# Patient Record
Sex: Female | Born: 1990
Health system: Southern US, Community
[De-identification: ages and names within clinical notes are randomized; demographics above are authoritative.]

---

## 2015-12-29 MED FILL — METHYLPHENIDATE 10 MG TAB: 10 | 30 days supply | Qty: 60 | Fill #0

## 2016-01-31 MED FILL — METHYLPHENIDATE ER 20 MG CA: 20 | 30 days supply | Qty: 30 | Fill #0

## 2016-06-28 MED FILL — CLINDAMYCIN HCL 300 MG CAP: 300 | 5 days supply | Qty: 15 | Fill #0

## 2016-07-12 MED FILL — NORETHIND-ETH ESTRAD 1-0.02: 1-20 | 28 days supply | Qty: 21 | Fill #0

## 2016-07-23 MED FILL — METHYLPHENIDATE ER 30 MG CA: 30 | 30 days supply | Qty: 30 | Fill #0

## 2016-08-17 MED FILL — NORETHIND-ETH ESTRAD 1-0.02: 1-20 | 84 days supply | Qty: 63 | Fill #1

## 2016-09-26 MED FILL — predniSONE 20 MG TABS: 20 | 5 days supply | Qty: 5 | Fill #0

## 2016-10-03 MED FILL — AZITHROMYCIN 250 MG TAB: 250 | 5 days supply | Qty: 6 | Fill #0

## 2016-11-12 MED FILL — NORETHIND-ETH ESTRAD 1-0.02: 1-20 | 84 days supply | Qty: 63 | Fill #2

## 2016-11-27 MED FILL — METHYLPHENIDATE ER 20 MG CA: 20 | 30 days supply | Qty: 30 | Fill #0

## 2016-12-10 MED FILL — VENTOLIN HFA 90 MCG INHALER: 108 (90 BAS | 16 days supply | Qty: 18 | Fill #0

## 2017-01-30 MED FILL — NORETHIND-ETH ESTRAD 1-0.02: 1-20 | 84 days supply | Qty: 63 | Fill #3

## 2017-03-29 MED FILL — predniSONE 50 MG TABS: 50 | 1 days supply | Qty: 3 | Fill #0

## 2017-04-24 IMAGING — CT CT NECK W/ CM
5 of 6 series · 15 of 33 positions shown, 17 images · IV contrast (ISOVUE 300)
Comparison: None.

CLINICAL DATA: Bilateral ear pain over the last year. Assess for
pharyngeal lesion.

EXAM:
CT NECK WITH CONTRAST
TECHNIQUE: Multidetector CT imaging of the neck was performed using the
standard protocol following the bolus administration of intravenous
contrast.
CONTRAST:  75 cc [D9]

[Series 2: axial neck · axial · 0.41mm/px · z∈[-311,-199]mm · 3 of 112 slices shown, 4 images]
[im 28/112  soft-tissue]
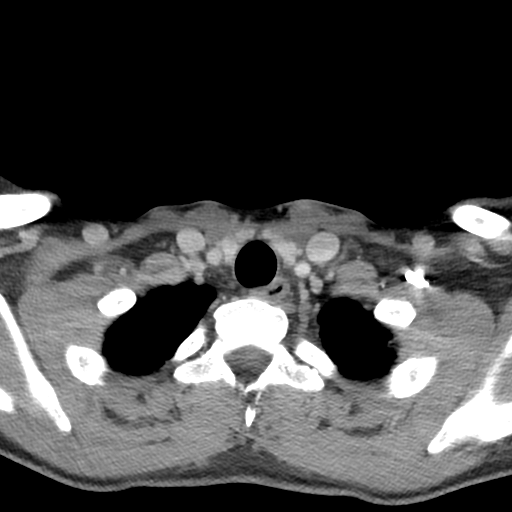
[im 28/112  bone]
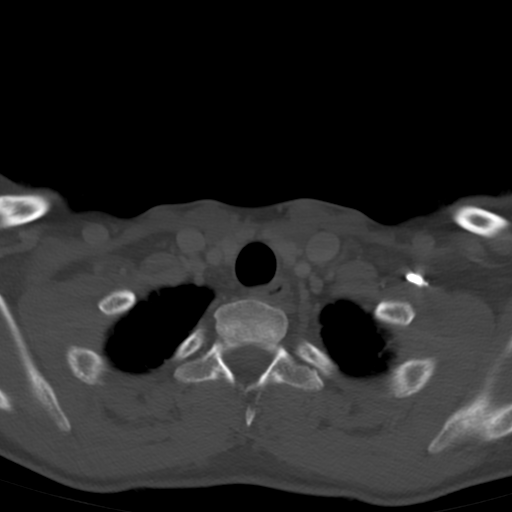
[im 56/112  bone]
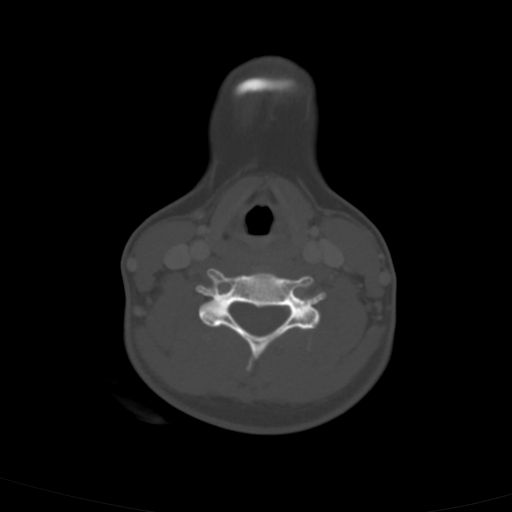
[im 84/112  bone]
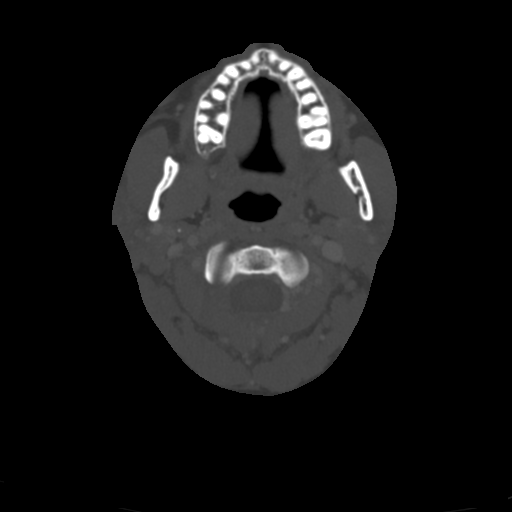

[Series 5: axial bone · axial · 0.41mm/px · z∈[-292,-218]mm · 2 of 112 slices shown]
[im 38/112  bone]
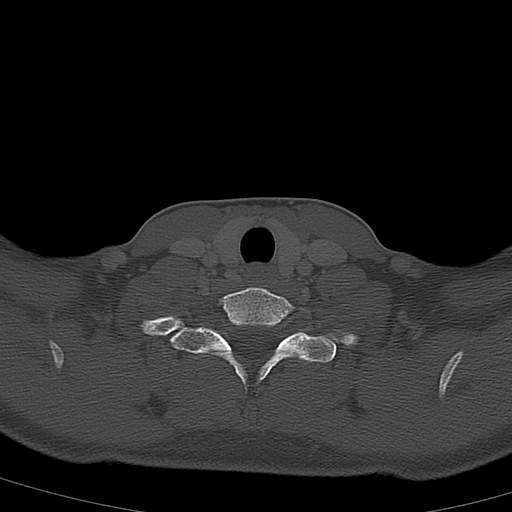
[im 75/112  bone]
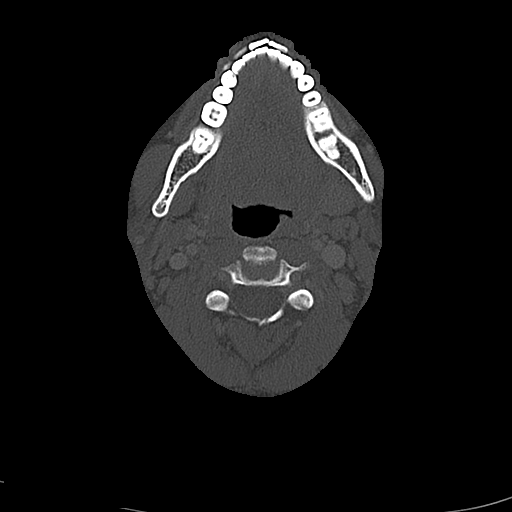

[Series 6: orthogonal ax · axial · 0.39mm/px · z∈[-307,-231]mm · 2 of 119 slices shown]
[im 40/119  bone]
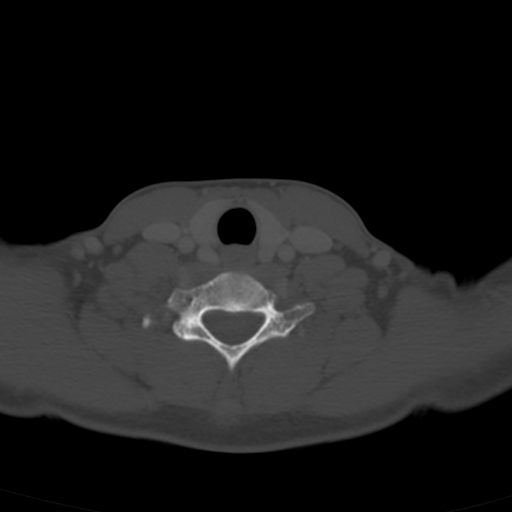
[im 79/119  bone]
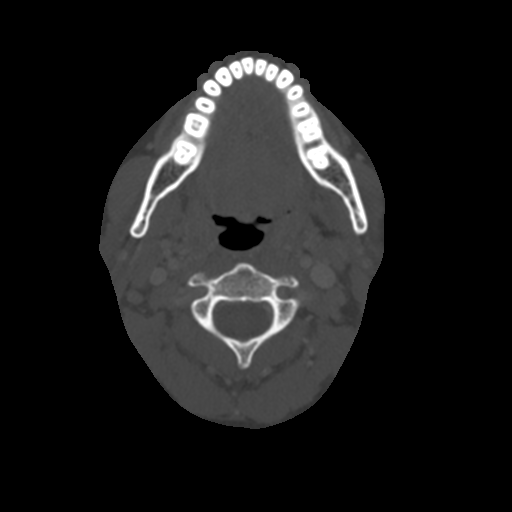

[Series 7: cor neck · coronal · 0.49mm/px · 3 of 101 slices shown]
[im 21/101  bone]
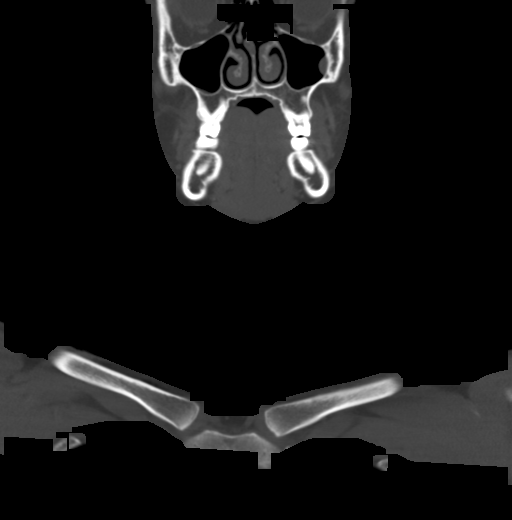
[im 41/101  bone]
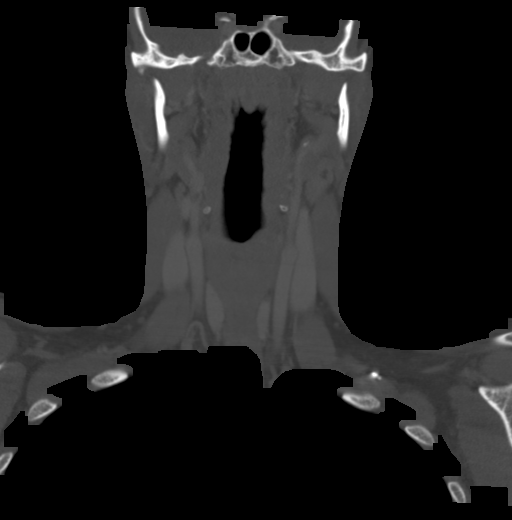
[im 61/101  bone]
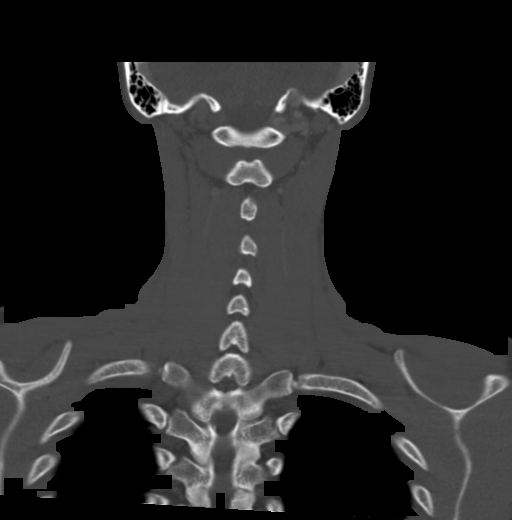

[Series 8: sag neck · sagittal · 0.49mm/px · 5 of 101 slices shown, 6 images]
[im 34/101  bone]
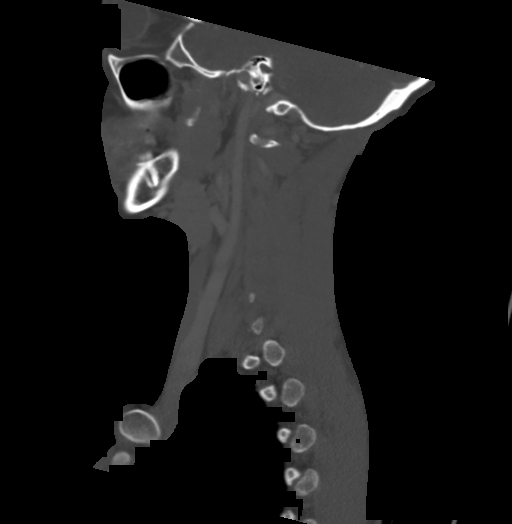
[im 42/101  bone]
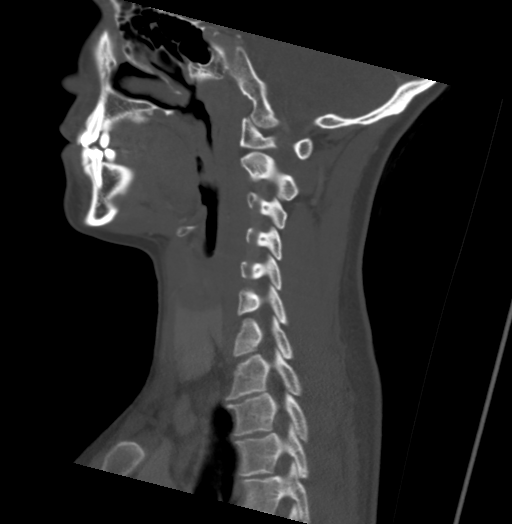
[im 51/101  soft-tissue]
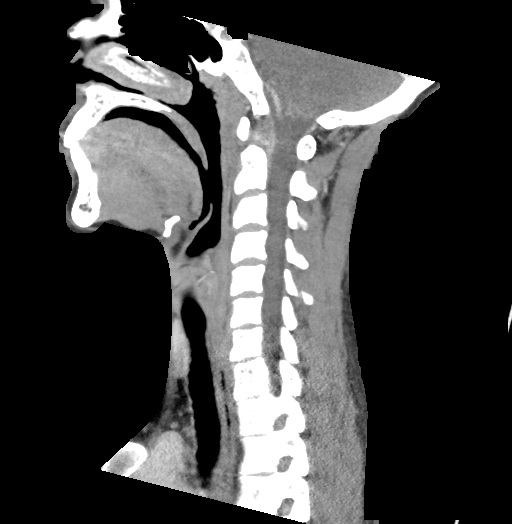
[im 51/101  bone]
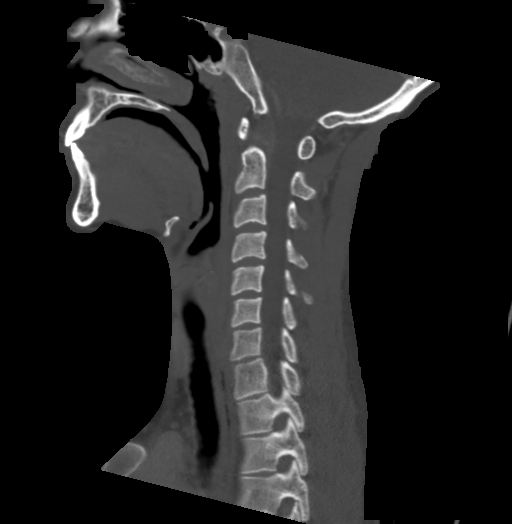
[im 59/101  bone]
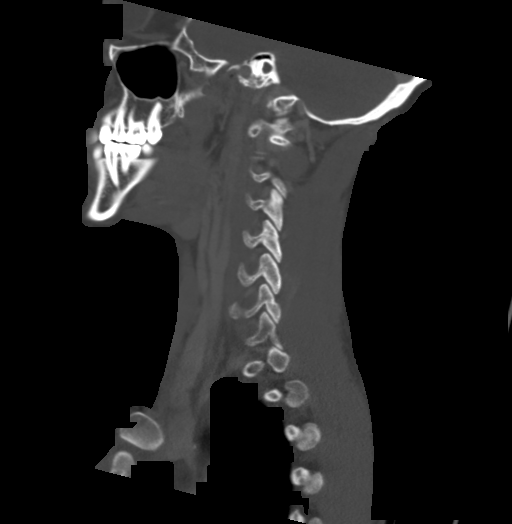
[im 67/101  bone]
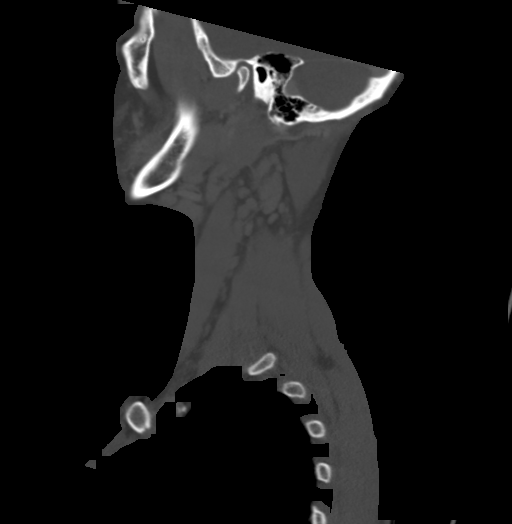

[15 of 33 positions shown; findings below may reference images not displayed]

FINDINGS: Pharynx and larynx: No mucosal or submucosal lesion is seen.
Nasopharynx appears normal. No lesions seen that 1 would expect to
affect the Eustacian tubes.

Salivary glands: Parotid and submandibular glands are normal.

Thyroid: Normal

Lymph nodes: No enlarged or low-density nodes seen on either side of
the neck. Normal nodes on each side.

Vascular: Normal

Limited intracranial: Normal

Visualized orbits: Normal

Mastoids and visualized paranasal sinuses: Visualized sinuses are
clear. Mastoid regions and middle ears appear clear and normal.

Skeleton: Normal.  Styloid processes within normal limits.

Upper chest: Normal

Other: None
IMPRESSION: No abnormality seen to explain the presenting symptoms. No
nasopharyngeal lesion. No sign of Eustacian tube dysfunction.

## 2017-04-24 MED FILL — NORETHIND-ETH ESTRAD 1-0.02: 1-20 | 84 days supply | Qty: 63 | Fill #0

## 2017-05-20 MED FILL — KETOCONAZOLE 200 MG TABLET: 200 | 3 days supply | Qty: 6 | Fill #0

## 2017-05-22 MED FILL — CLOTRIMAZOLE-BETAMETHASONE: 1-0.05 | 30 days supply | Qty: 45 | Fill #0

## 2017-07-12 MED FILL — MONTELUKAST SOD 10 MG TAB: 10 | 30 days supply | Qty: 30 | Fill #0

## 2017-07-12 MED FILL — METHYLPHENIDATE ER 20 MG CA: 20 | 30 days supply | Qty: 30 | Fill #0

## 2017-07-17 MED FILL — AZITHROMYCIN 250 MG TAB: 250 | 3 days supply | Qty: 6 | Fill #0

## 2017-07-17 MED FILL — ONDANSETRON ODT 8 MG TABLET: 8 | 3 days supply | Qty: 10 | Fill #0

## 2017-07-17 MED FILL — NORETHIND-ETH ESTRAD 1-0.02: 1-20 | 84 days supply | Qty: 63 | Fill #1

## 2017-07-17 MED FILL — ATOVAQUONE-PROGUANIL 250-10: 250-100 | 16 days supply | Qty: 16 | Fill #0

## 2017-07-26 MED FILL — ONDANSETRON HCL 4 MG TABLET: 4 | 7 days supply | Qty: 20 | Fill #0

## 2017-07-26 MED FILL — AZITHROMYCIN 250 MG TABS: 250 | 5 days supply | Qty: 6 | Fill #0

## 2017-07-29 MED FILL — ATOVAQUONE-PROGUANIL 250-10: 250-100 | 90 days supply | Qty: 90 | Fill #0

## 2017-10-09 MED FILL — NORETHIND-ETH ESTRAD 1-0.02: 1-20 | 63 days supply | Qty: 63 | Fill #2

## 2017-12-17 MED FILL — CLOBETASOL 0.05% SOLUTION: 0.05 | 20 days supply | Qty: 50 | Fill #0

## 2017-12-31 MED FILL — LEVONOR-ETH ESTRAD 0.15-0.0: 0.15-30 | 84 days supply | Qty: 84 | Fill #0

## 2018-01-08 MED FILL — VENTOLIN HFA 90 MCG INHALER: 108 (90 BAS | 16 days supply | Qty: 18 | Fill #0

## 2018-12-08 IMAGING — US ULTRASOUND RIGHT BREAST LIMITED
1 series · 4 of 4 positions shown · non-contrast
Comparison: None.

CLINICAL DATA: 27-year-old female complaining of palpable
abnormalities in the medial and upper aspects of the right breast.

EXAM:
ULTRASOUND OF THE RIGHT BREAST

[Series 1: ultrasound right breast limited · 0.06mm/px · 4 of 4 slices shown]
[im 1/4]
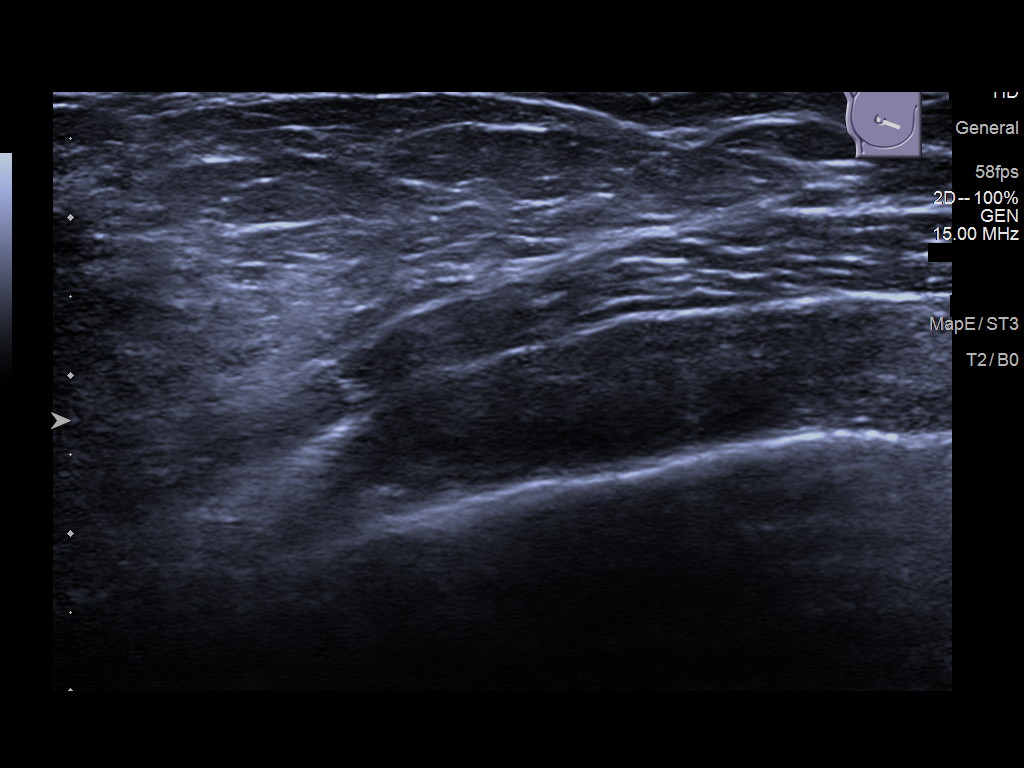
[im 2/4]
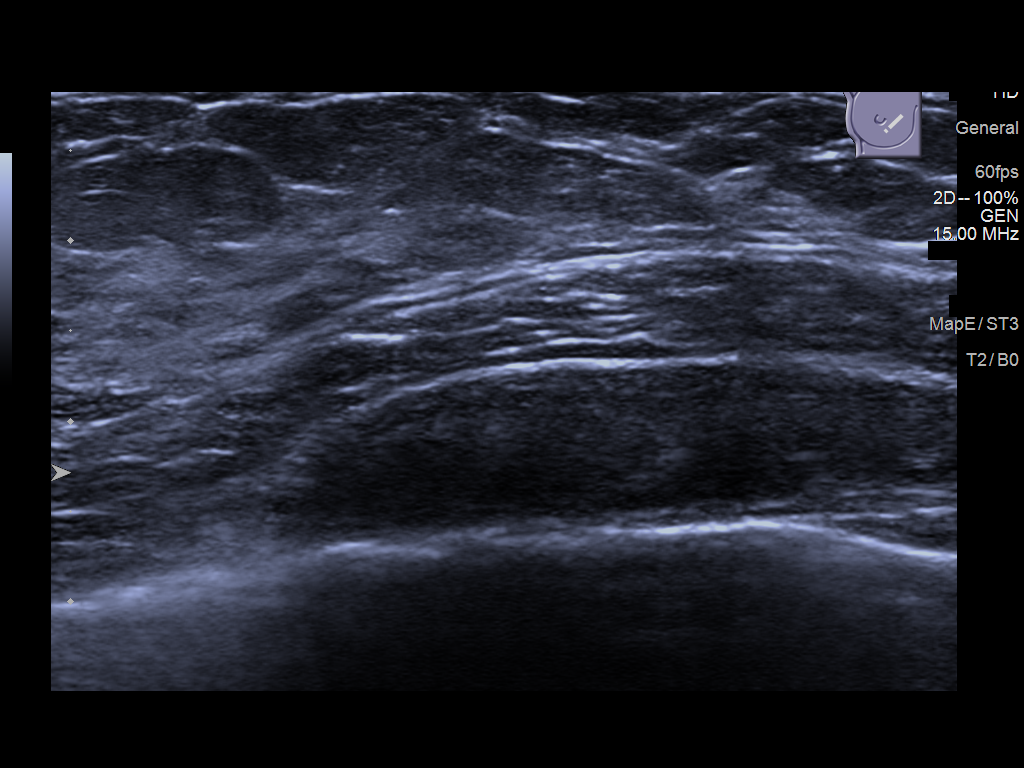
[im 3/4]
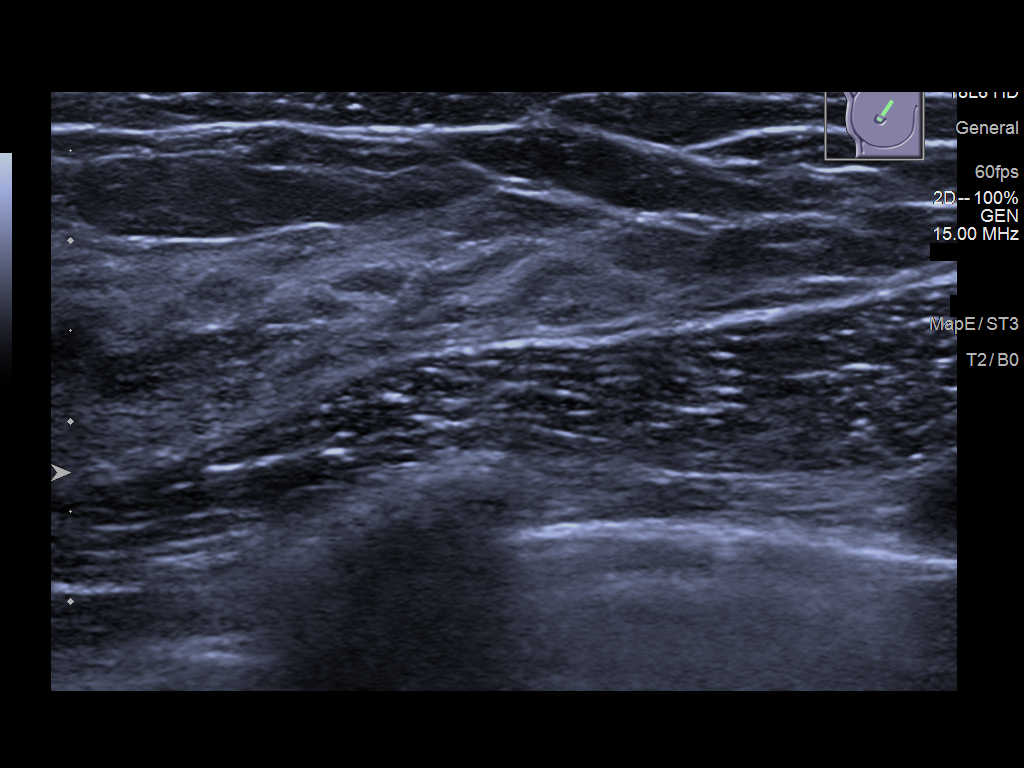
[im 4/4]
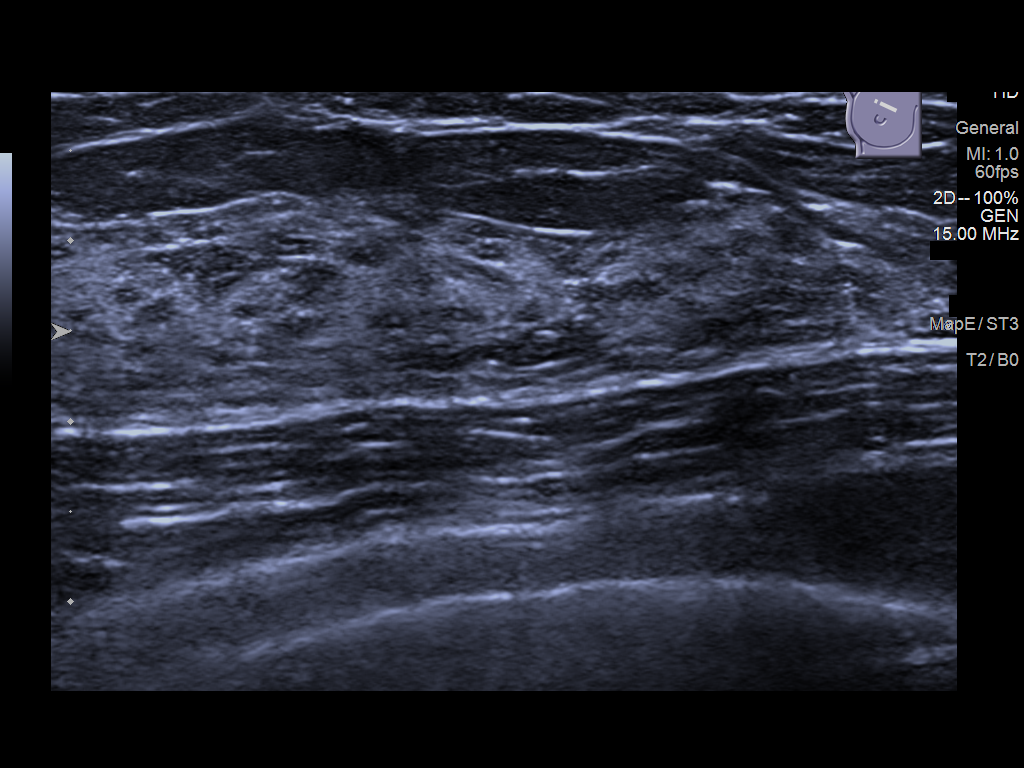

[4 of 4 positions shown; findings below may reference images not displayed]

FINDINGS: On physical exam, I do not palpate a mass in the superior aspect of
the right breast or the medial aspect of the right breast.

Targeted ultrasound is performed, showing normal tissue in the areas
of clinical concern in the medial aspect of the right breast as well
as the superior aspect of the breast. No solid or cystic mass,
abnormal shadowing or distortion visualized.
IMPRESSION: No sonographic evidence of malignancy in the areas of clinical
concern.

RECOMMENDATION:
If the clinical exam remains benign/stable screening mammography can
be deferred until the age 39.

I have discussed the findings and recommendations with the patient.
Results were also provided in writing at the conclusion of the
visit. If applicable, a reminder letter will be sent to the patient
regarding the next appointment.

BI-RADS CATEGORY  1: Negative.

## 2019-02-12 MED FILL — ALPRAZolam 0.25 MG TABS: 0.25 | 30 days supply | Qty: 30 | Fill #0

## 2019-03-03 MED FILL — ALPRAZolam 0.25 MG TABS: 0.25 | 30 days supply | Qty: 30 | Fill #0

## 2019-10-17 IMAGING — MR MR THORACIC SPINE W/O CM
6 of 7 series · 29 of 48 positions shown · non-contrast
Comparison: None available.

CLINICAL DATA: Initial evaluation for headaches, family history of
familial cerebral cavernous malformation with prior hemorrhage.

EXAM:
MRI THORACIC SPINE WITHOUT CONTRAST
TECHNIQUE: Multiplanar, multisequence MR imaging of the thoracic spine was
performed. No intravenous contrast was administered.

[Series 18: T1 · sagittal · 6.0mm · 1.41mm/px · 4 of 9 slices shown (1 of 2)]
[im 1/9]
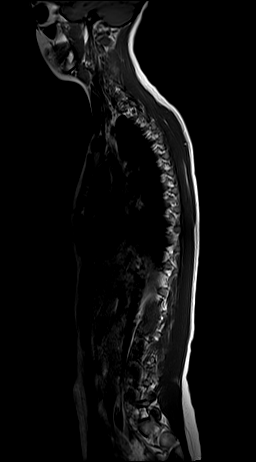
[im 3/9]
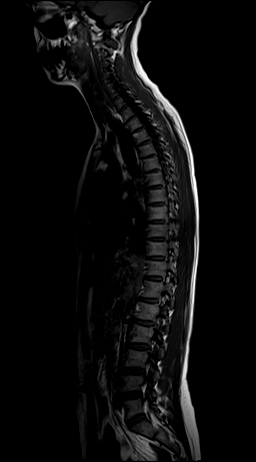
[im 6/9]
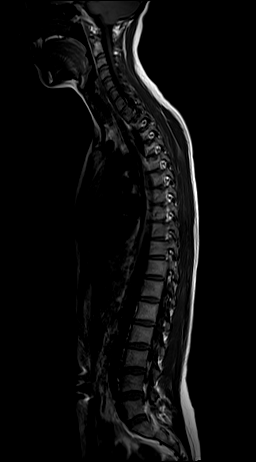
[im 9/9]
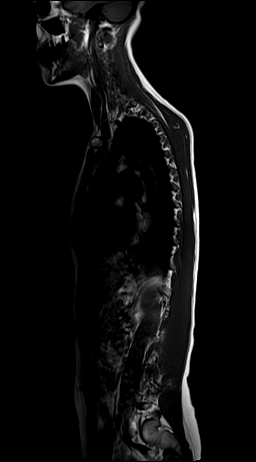

[Series 19: T2 · sagittal · 3.0mm · 1.06mm/px · 5 of 17 slices shown (1 of 2)]
[im 1/17]
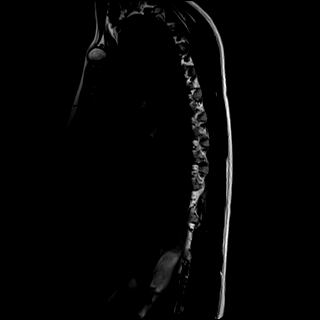
[im 5/17]
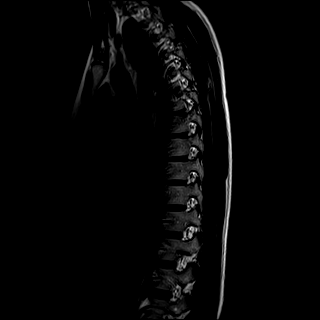
[im 9/17]
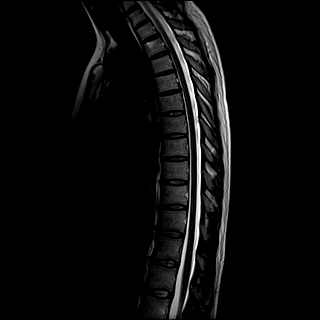
[im 13/17]
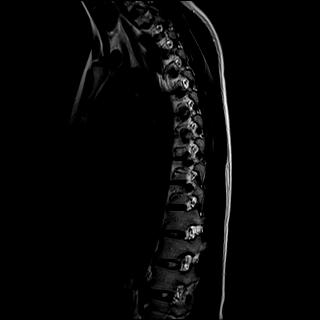
[im 17/17]
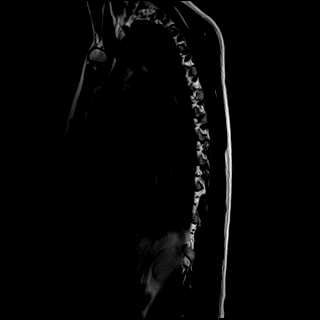

[Series 20: T1 · sagittal · 3.0mm · 1.06mm/px · 5 of 17 slices shown (2 of 2)]
[im 1/17]
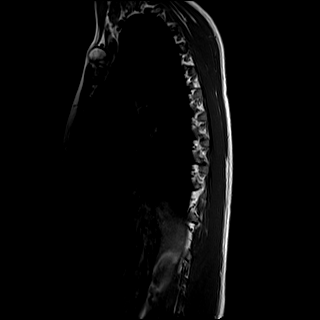
[im 5/17]
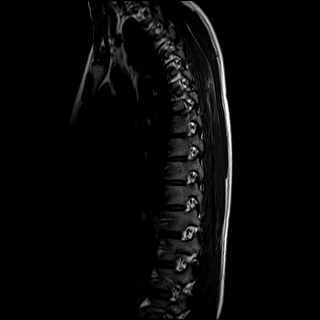
[im 9/17]
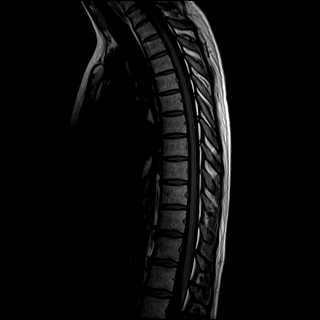
[im 13/17]
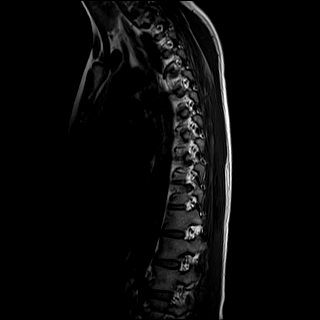
[im 17/17]
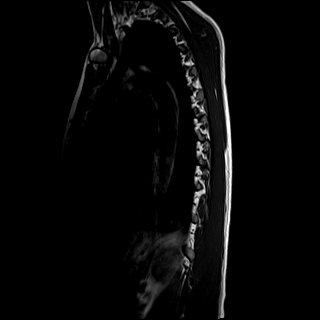

[Series 21: STIR · sagittal · 3.0mm · 0.53mm/px · 5 of 17 slices shown]
[im 1/17]
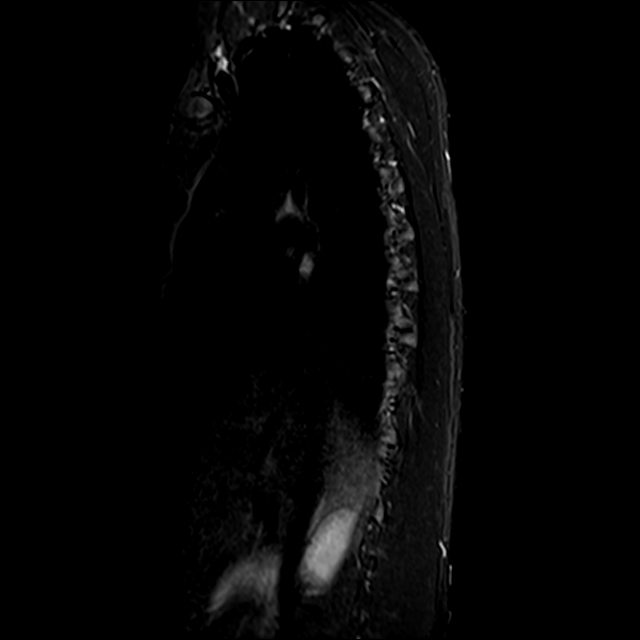
[im 5/17]
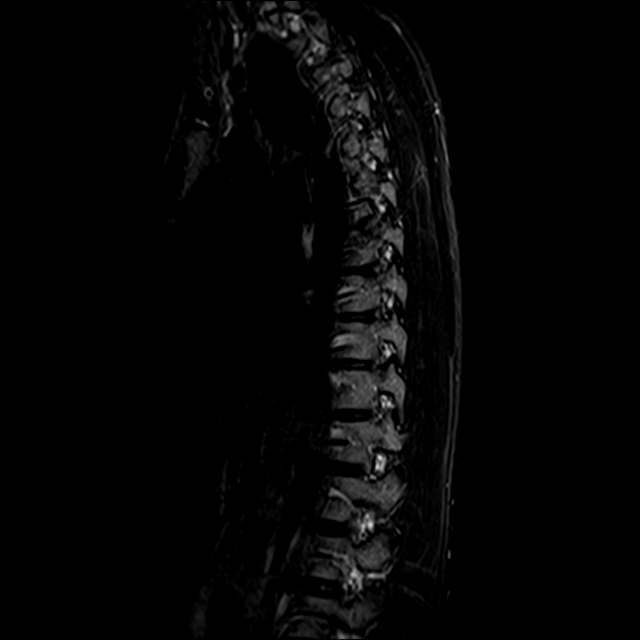
[im 9/17]
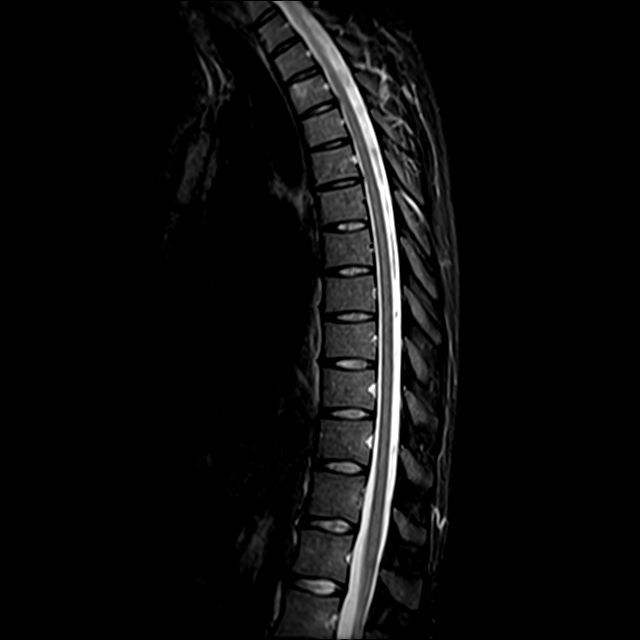
[im 13/17]
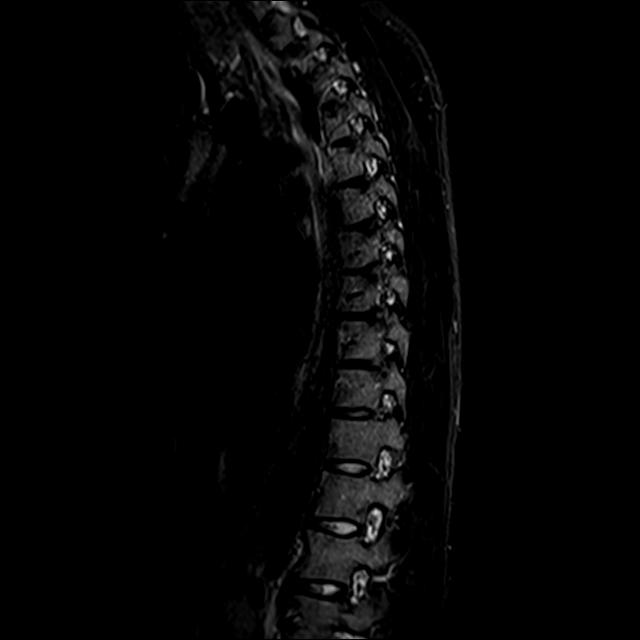
[im 17/17]
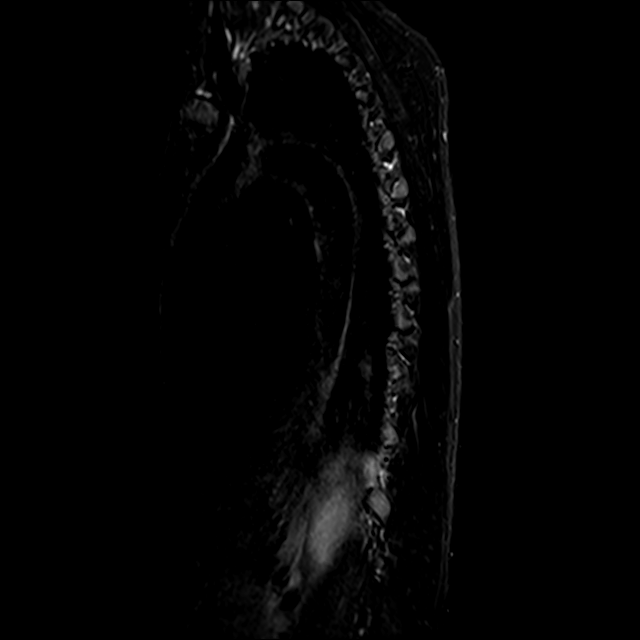

[Series 22: T2-star · sagittal · 3.0mm · 0.66mm/px · 1 of 17 slices shown]
[im 1/17]
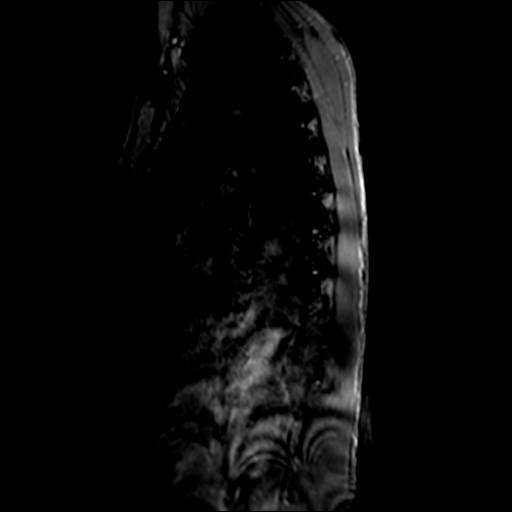

[Series 23: T2 · axial · 4.0mm · 0.59mm/px · z∈[-341,-112]mm · 9 of 39 slices shown (2 of 2)]
[im 1/39]
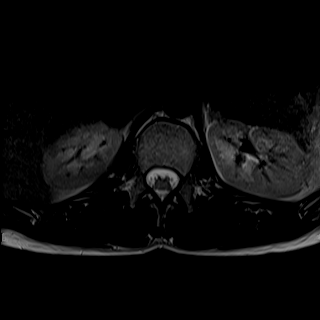
[im 7/39]
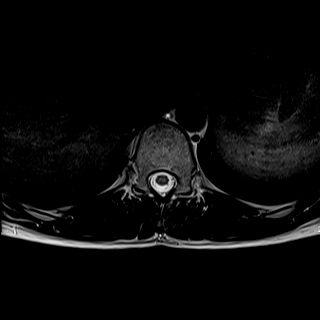
[im 11/39]
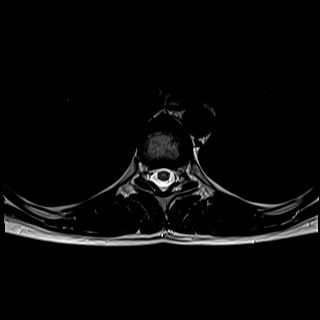
[im 18/39]
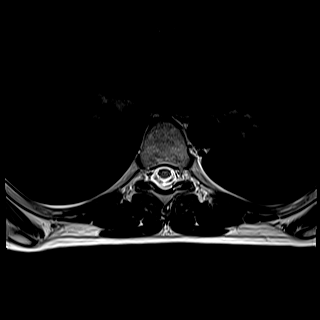
[im 21/39]
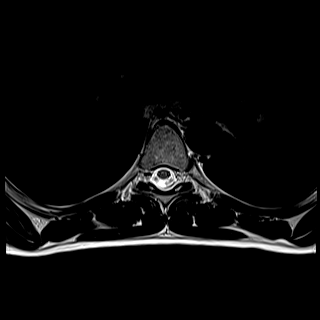
[im 28/39]
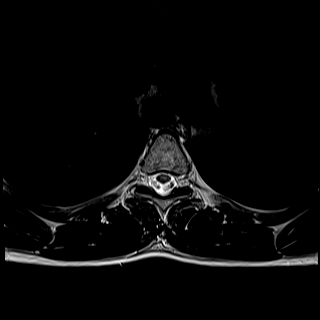
[im 32/39]
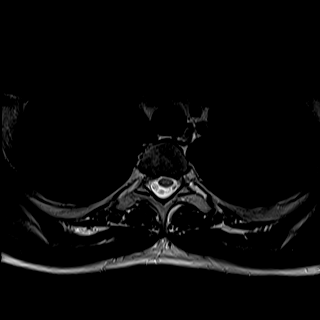
[im 35/39]
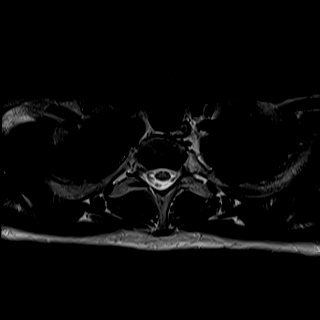
[im 39/39]
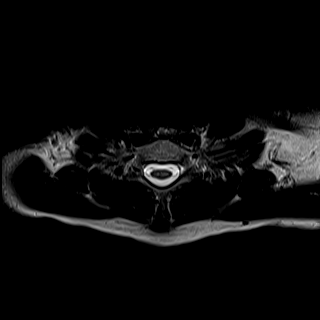

[29 of 48 positions shown; findings below may reference images not displayed]

FINDINGS: Alignment: Vertebral bodies normally aligned with preservation of
the normal thoracic kyphosis. No listhesis or subluxation.

Vertebrae: Vertebral body height well maintained without evidence
for acute or chronic fracture. Few small benign hemangiomata noted,
most prominent of which measures 9 mm at the T3 vertebral body. No
worrisome osseous lesions. No abnormal marrow edema.

Cord: Signal intensity within the thoracic spinal cord is normal.
Normal cord caliber and morphology. No susceptibility artifact to
suggest acute or prior hemorrhage.

Paraspinal and other soft tissues: Paraspinous soft tissues within
normal limits. Partially visualized lungs are clear. Visualized
visceral structures are unremarkable.

Disc levels:

T2-3: Minimal disc bulge. No canal or foraminal stenosis. No cord
deformity.

T3-4: Tiny right paracentral disc protrusion mildly a indents the
right ventral thecal sac. No significant stenosis or cord deformity.
Foramina remain patent.

T4-5: Minimal disc bulge with superimposed tiny right paracentral
disc protrusion. No significant spinal stenosis or cord deformity.
Foramina remain patent.

T5-6: Tiny central disc protrusion minimally indents the ventral
thecal sac, slightly asymmetric to the right. No significant
stenosis or cord deformity. Foramina remain widely patent.

No other significant disc pathology seen within the thoracic spine.
No significant facet degeneration. No other stenosis or evidence for
impingement.
IMPRESSION: 1. Tiny disc protrusions at T3-4 through T5-6 without significant
stenosis or cord impingement as above.
2. Otherwise unremarkable and normal MRI of the thoracic spine. No
evidence for acute or prior hemorrhage involving the thoracic spinal
cord.

## 2019-10-17 IMAGING — MR MR HEAD W/O CM
11 of 12 series · 39 of 48 positions shown · non-contrast
Comparison: None available.

CLINICAL DATA: Initial evaluation for headaches. Family history of
familial cerebral cavernous malformation with prior hemorrhage.

EXAM:
MRI HEAD WITHOUT CONTRAST
MRA HEAD WITHOUT CONTRAST
TECHNIQUE: Multiplanar, multiecho pulse sequences of the brain and surrounding
structures were obtained without intravenous contrast. Angiographic
images of the head were obtained using MRA technique without
contrast.

[Series 11: ax dwi_tracew · axial · 3.0mm · 0.60mm/px · z∈[-80,+73]mm · 4 of 48 slices shown]
[im 1/48]
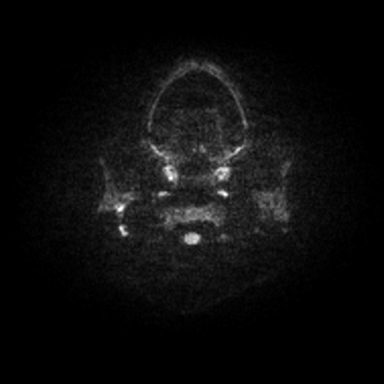
[im 16/48]
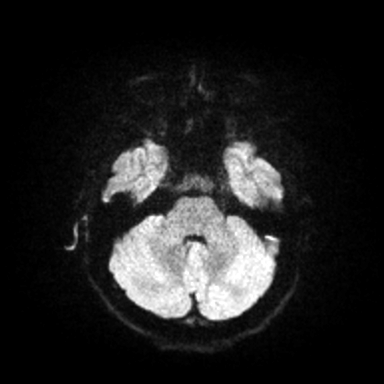
[im 32/48]
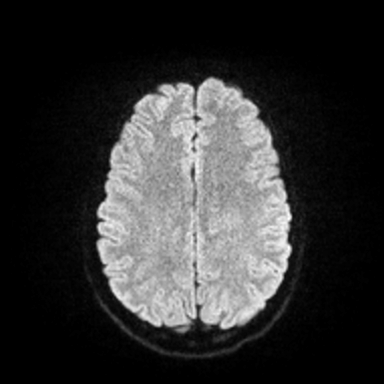
[im 48/48]
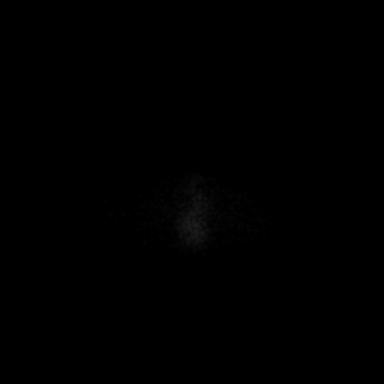

[Series 12: ax dwi_adc · axial · 3.0mm · 0.60mm/px · z∈[-80,+70]mm · 3 of 47 slices shown]
[im 1/47]
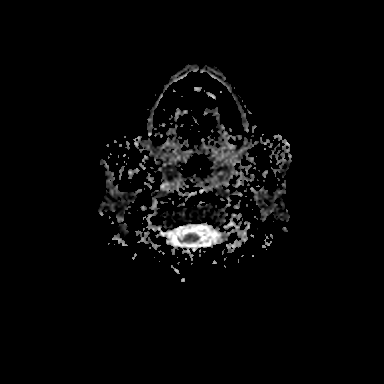
[im 24/47]
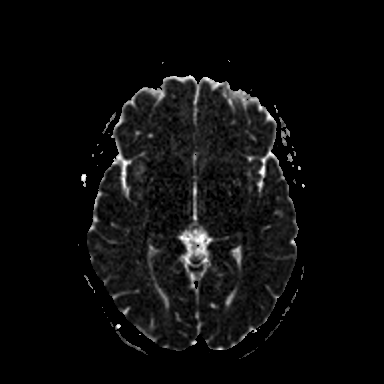
[im 47/47]
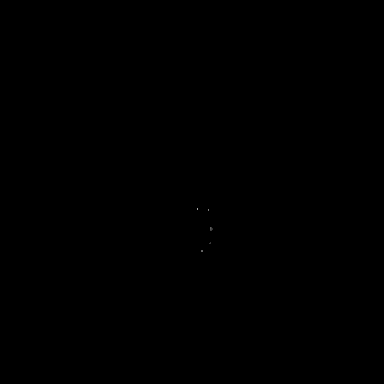

[Series 13: cor dwi_tracew · coronal · 5.0mm · 0.60mm/px · 3 of 38 slices shown]
[im 1/38]
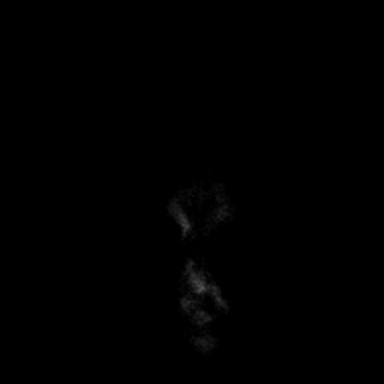
[im 19/38]
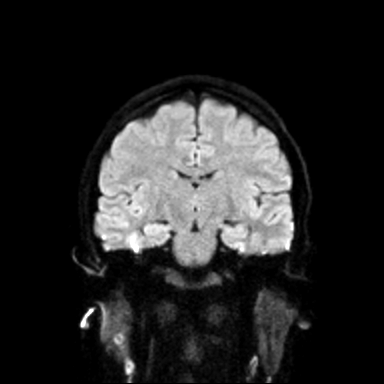
[im 38/38]
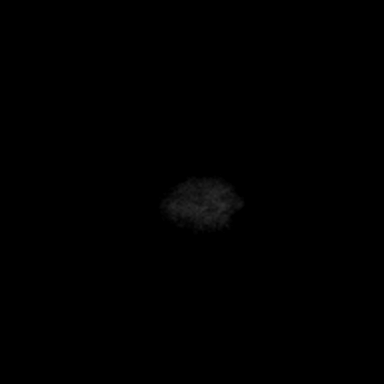

[Series 14: cor dwi_adc · coronal · 5.0mm · 0.60mm/px · 3 of 37 slices shown]
[im 1/37]
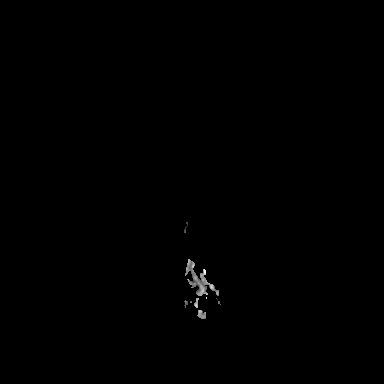
[im 19/37]
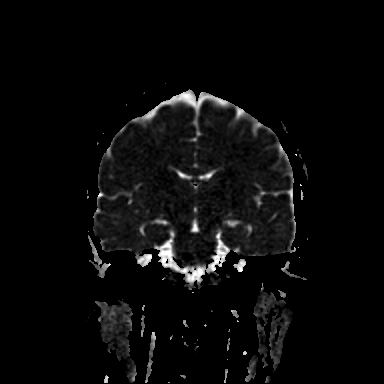
[im 37/37]
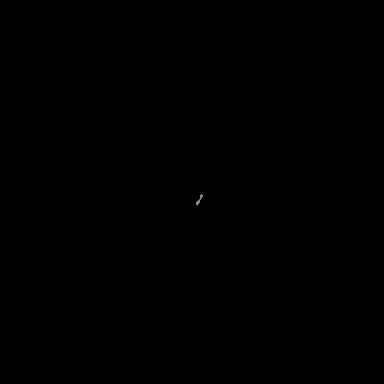

[Series 15: T1 · sagittal · 5.0mm · 0.62mm/px · 2 of 21 slices shown (1 of 2)]
[im 1/21]
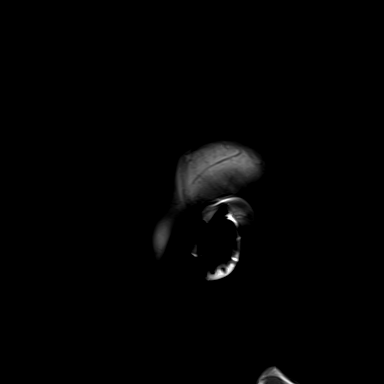
[im 21/21]
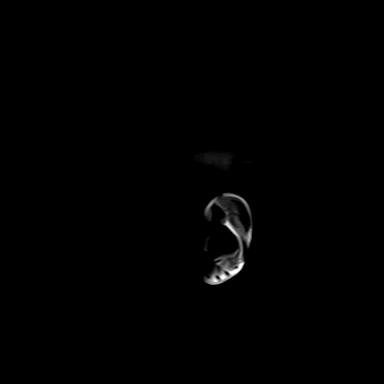

[Series 16: T2 · axial · 5.0mm · 0.53mm/px · z∈[-70,+73]mm · 2 of 25 slices shown (1 of 2)]
[im 1/25]
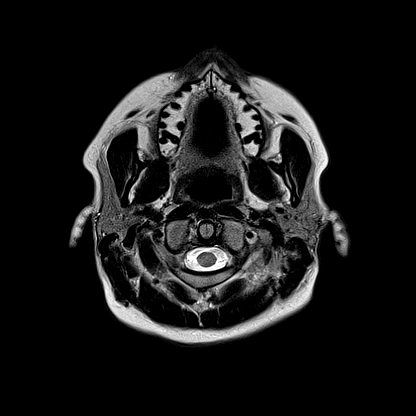
[im 25/25]
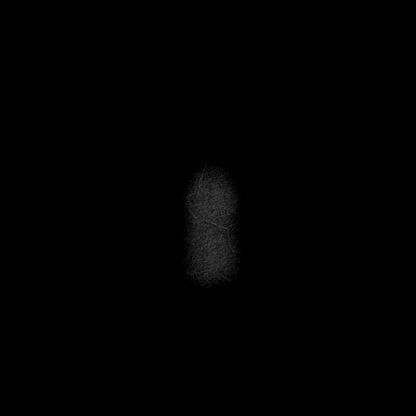

[Series 17: mag_images · axial · 3.0mm · 0.90mm/px · z∈[-86,+89]mm · 4 of 60 slices shown]
[im 1/60]
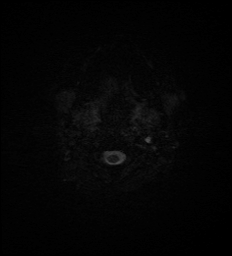
[im 20/60]
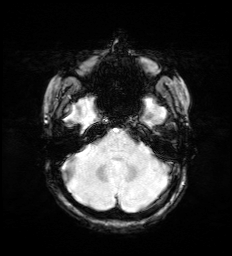
[im 40/60]
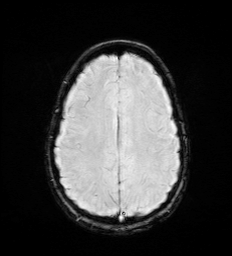
[im 60/60]
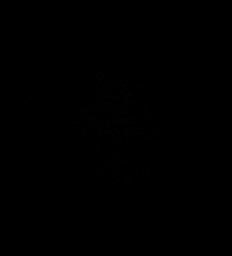

[Series 18: pha_images · axial · 3.0mm · 0.90mm/px · z∈[-86,+89]mm · 4 of 58 slices shown]
[im 1/58]
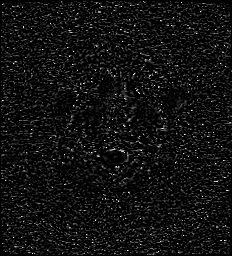
[im 20/58]
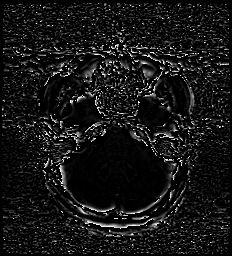
[im 39/58]
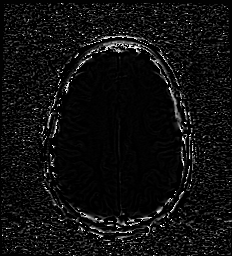
[im 58/58]
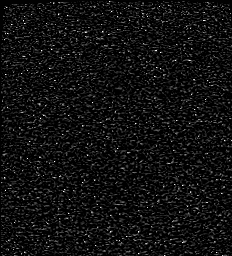

[Series 21: FLAIR · axial · 3.0mm · 0.53mm/px · z∈[-78,+82]mm · 4 of 55 slices shown]
[im 1/55]
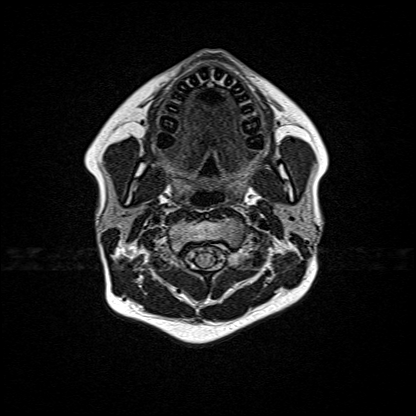
[im 19/55]
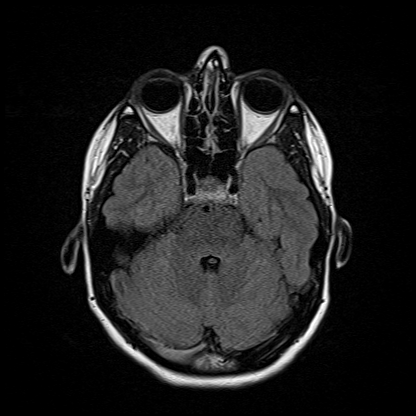
[im 37/55]
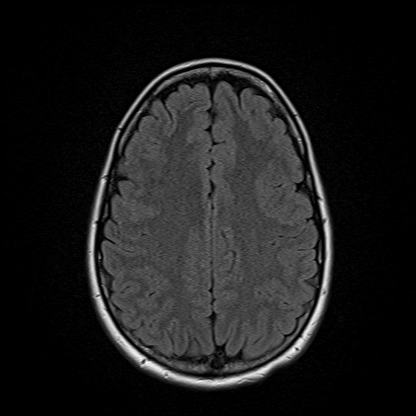
[im 55/55]
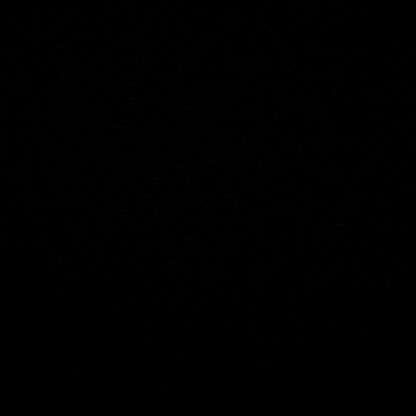

[Series 22: T1 · axial · 1.0mm · 0.98mm/px · z∈[-85,+87]mm · 8 of 174 slices shown (2 of 2)]
[im 1/174]
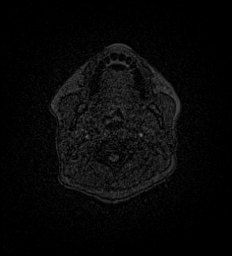
[im 29/174]
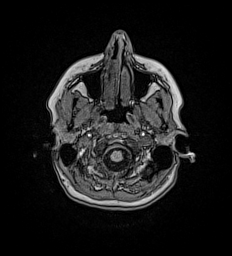
[im 58/174]
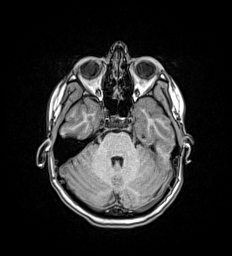
[im 73/174]
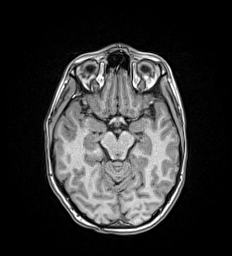
[im 101/174]
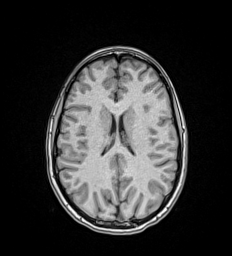
[im 116/174]
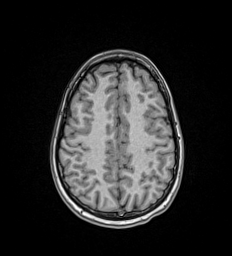
[im 145/174]
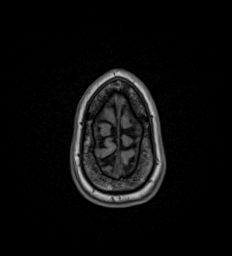
[im 174/174]
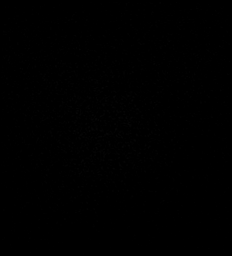

[Series 23: T2 · coronal · 5.0mm · 0.57mm/px · 2 of 29 slices shown (2 of 2)]
[im 1/29]
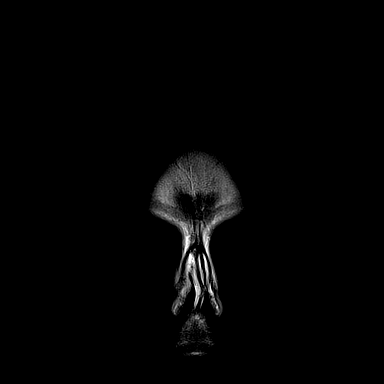
[im 29/29]
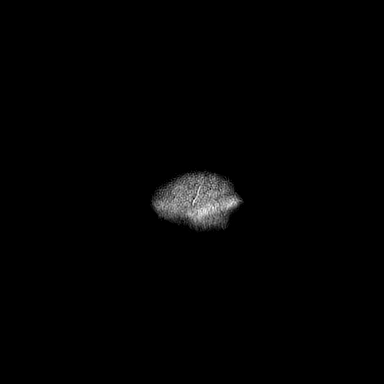

[39 of 48 positions shown; findings below may reference images not displayed]

FINDINGS: MRI HEAD FINDINGS

Brain: Cerebral volume within normal limits for patient age. No
focal parenchymal signal abnormality identified.

No abnormal foci of restricted diffusion to suggest acute or
subacute ischemia. Gray-white matter differentiation well
maintained. No encephalomalacia to suggest chronic infarction. No
foci of susceptibility artifact to suggest acute or chronic
intracranial hemorrhage. No intraparenchymal cavernoma identified.

No mass lesion, midline shift or mass effect. No hydrocephalus. No
extra-axial fluid collection. Major dural sinuses are grossly
patent.

Pituitary gland and suprasellar region are normal. Midline
structures intact and normal.

Vascular: Major intracranial vascular flow voids well maintained and
normal in appearance.

Skull and upper cervical spine: Craniocervical junction normal.
Visualized upper cervical spine within normal limits. Bone marrow
signal intensity normal. No scalp soft tissue abnormality.

Sinuses/Orbits: Globes and orbital soft tissues within normal
limits.

Mild scattered mucosal thickening noted within the ethmoidal air
cells. Small retention cyst noted within the left maxillary sinus.
Paranasal sinuses are otherwise clear. No mastoid effusion. Inner
ear structures grossly normal.

Other: None.

MRA HEAD FINDINGS

ANTERIOR CIRCULATION:

Visualized distal cervical segments of the internal carotid arteries
are widely patent with symmetric antegrade flow. Petrous, cavernous,
and supraclinoid ICAs widely patent without stenosis or other
abnormality. Origin of the ophthalmic arteries patent. ICA termini
well perfused. A1 segments patent bilaterally. Normal anterior
communicating artery complex. Anterior cerebral arteries widely
patent to their distal aspects without stenosis. No M1 stenosis or
occlusion. Normal MCA bifurcations. Distal MCA branches well
perfused and symmetric.

POSTERIOR CIRCULATION:

Vertebral arteries largely codominant and widely patent to the
vertebrobasilar junction. Both picas patent. Basilar widely patent
to its distal aspect without stenosis. Superior cerebral arteries
patent bilaterally. PCAs supplied via the basilar as well as
prominent bilateral posterior communicating arteries. Both PCAs well
perfused to their distal aspects without stenosis.

No intracranial aneurysm or other vascular abnormality.
IMPRESSION: 1. Normal brain MRI. No findings to explain patient's symptoms
identified. No intracranial cavernoma or evidence for acute or
chronic intracranial hemorrhage.
2. Normal intracranial MRA. No aneurysm or other vascular
abnormality identified.

## 2019-10-17 IMAGING — MR MR CERVICAL SPINE W/O CM
6 series · 34 of 48 positions shown · non-contrast
Comparison: None available.
COMPARISON: None available.

Addendum:
CLINICAL DATA: Initial evaluation for headaches for approximately 2
years.

EXAM:
MRI CERVICAL SPINE WITHOUT CONTRAST
TECHNIQUE: Multiplanar, multisequence MR imaging of the cervical spine was
performed. No intravenous contrast was administered.

[Series 1: T2 · sagittal · 3.0mm · 0.62mm/px · 6 of 15 slices shown (1 of 2)]
[im 1/15]
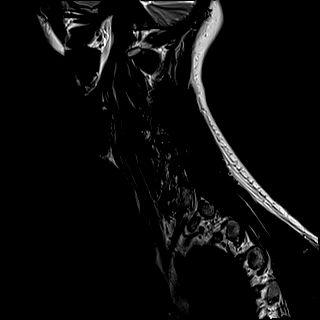
[im 3/15]
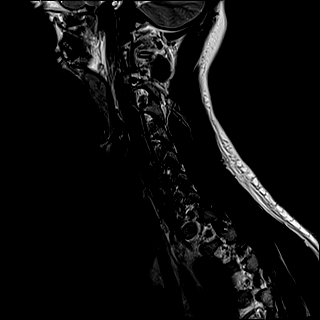
[im 6/15]
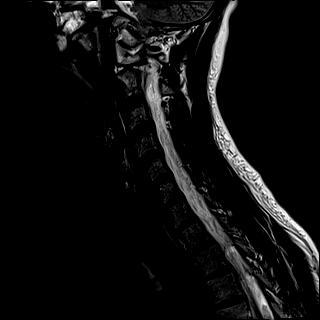
[im 9/15]
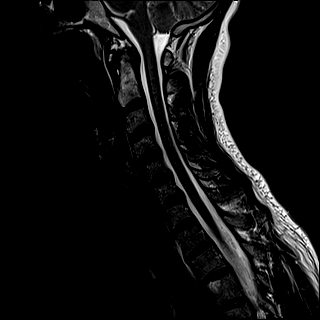
[im 12/15]
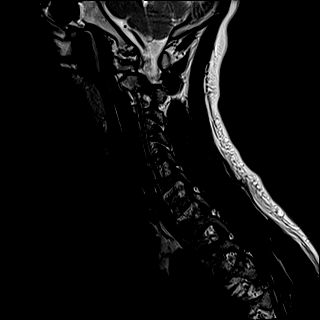
[im 15/15]
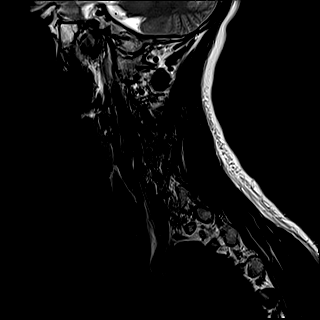

[Series 2: FLAIR · sagittal · 3.0mm · 0.78mm/px · 6 of 15 slices shown]
[im 1/15]
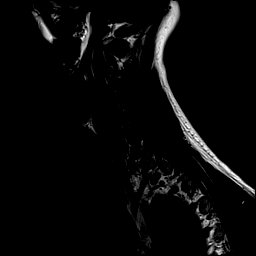
[im 3/15]
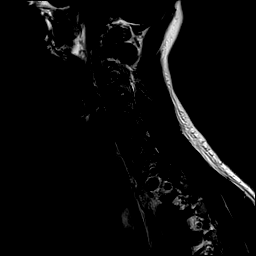
[im 6/15]
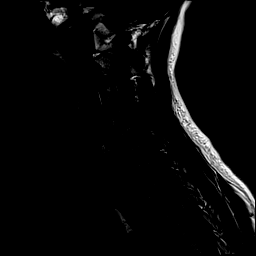
[im 9/15]
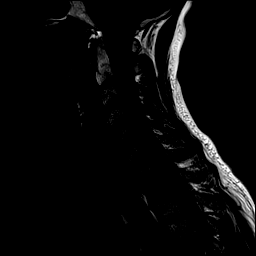
[im 12/15]
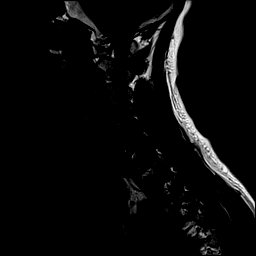
[im 15/15]
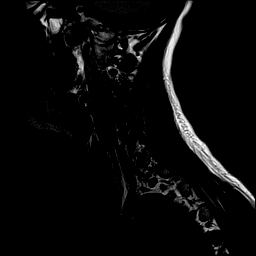

[Series 3: STIR · sagittal · 3.0mm · 0.62mm/px · 6 of 15 slices shown]
[im 1/15]
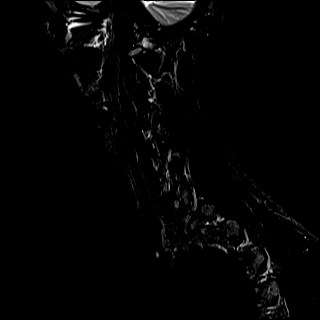
[im 3/15]
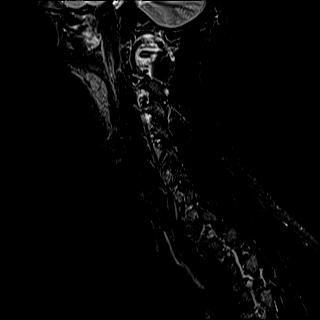
[im 6/15]
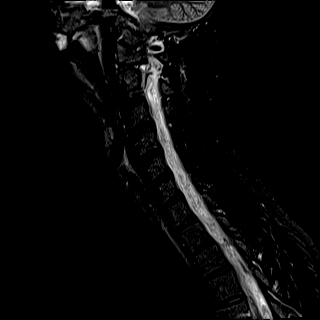
[im 9/15]
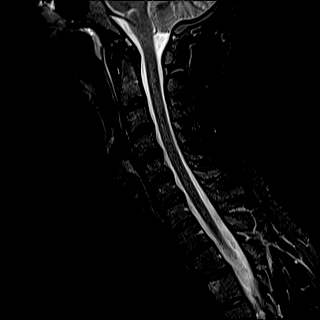
[im 12/15]
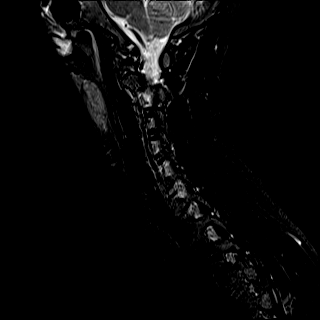
[im 15/15]
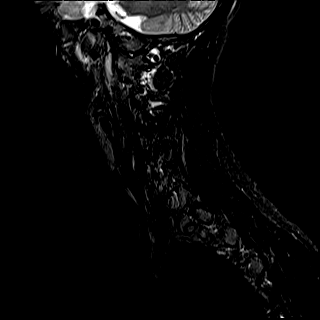

[Series 4: T2-star · sagittal · 3.0mm · 0.39mm/px · 6 of 15 slices shown]
[im 1/15]
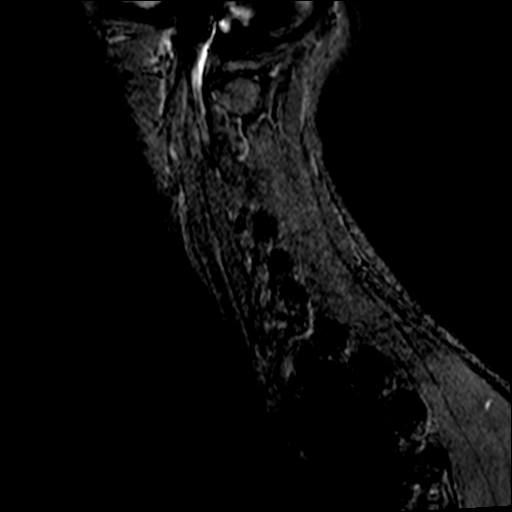
[im 3/15]
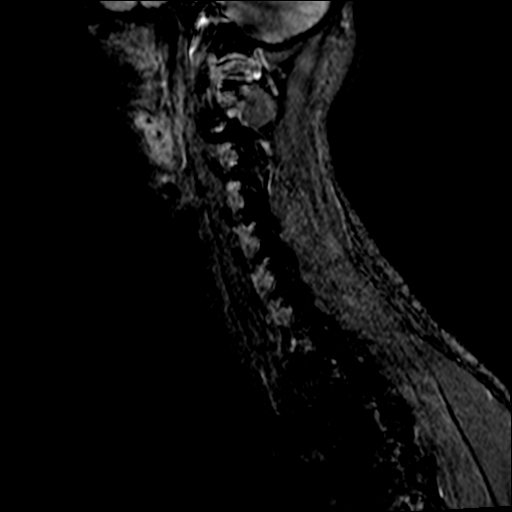
[im 6/15]
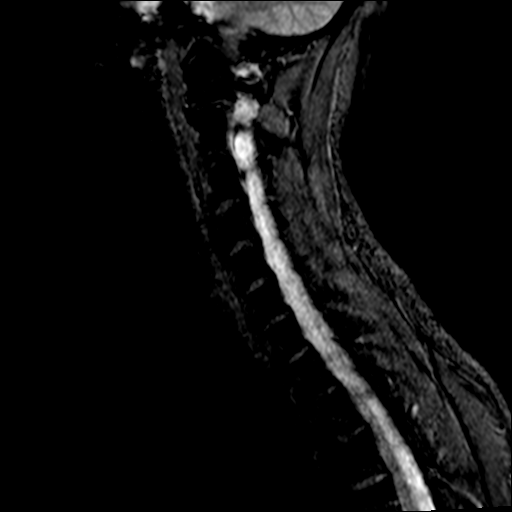
[im 9/15]
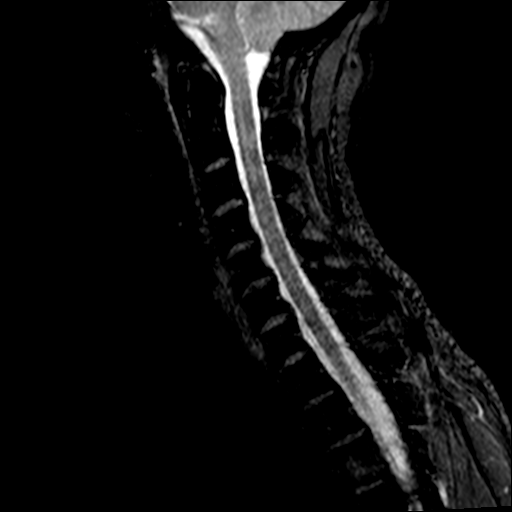
[im 12/15]
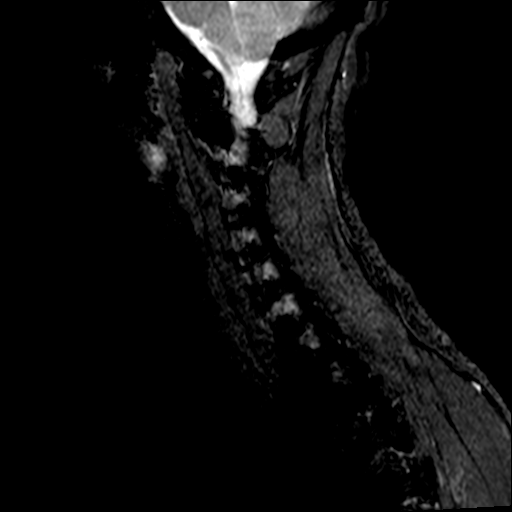
[im 15/15]
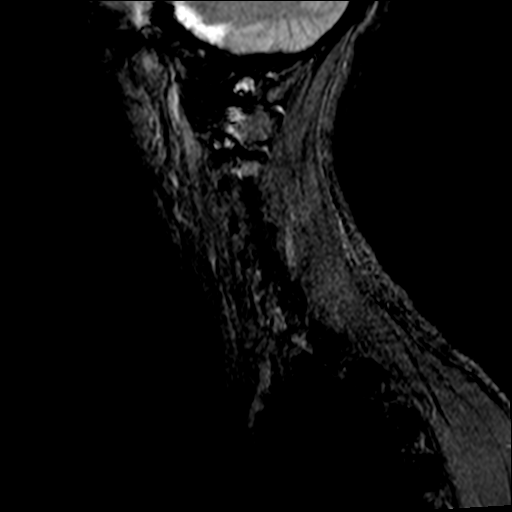

[Series 5: T2 · axial · 3.0mm · 0.70mm/px · z∈[-108,-19]mm · 9 of 29 slices shown (2 of 2)]
[im 1/29]
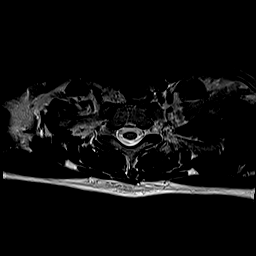
[im 6/29]
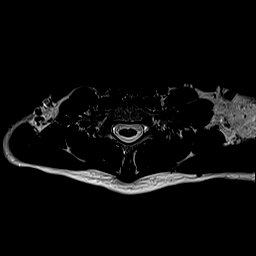
[im 8/29]
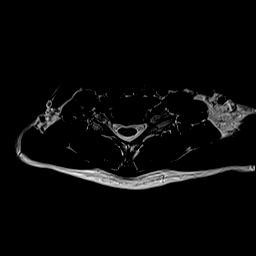
[im 13/29]
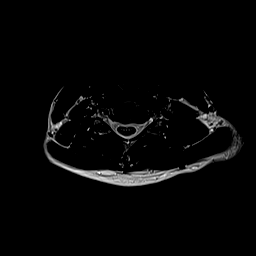
[im 16/29]
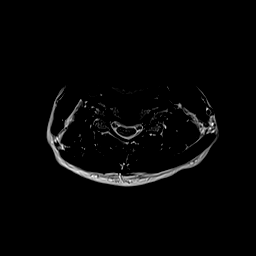
[im 21/29]
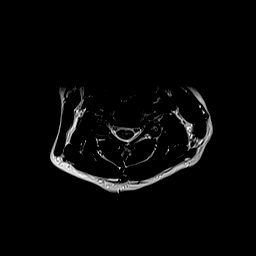
[im 23/29]
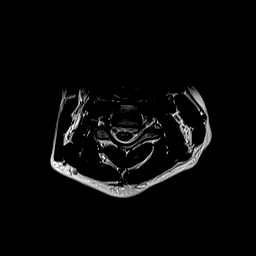
[im 26/29]
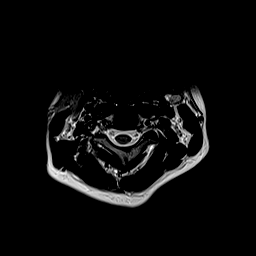
[im 29/29]
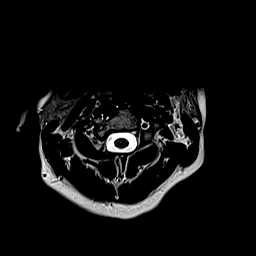

[Series 6: ax mpgr · axial · 3.0mm · 0.35mm/px · 1 of 29 slices shown]
[im 1/29]
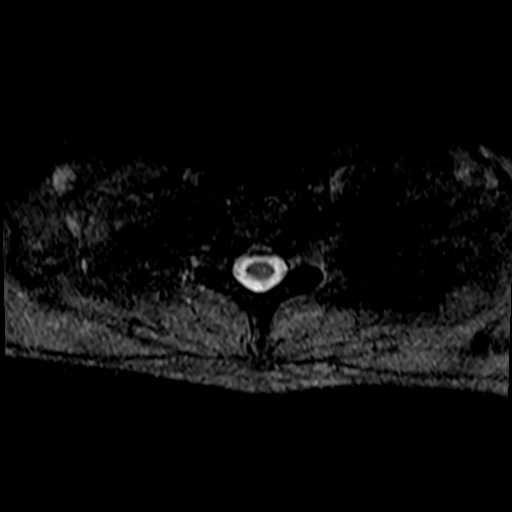

[34 of 48 positions shown; findings below may reference images not displayed]

FINDINGS: Alignment: Straightening of the normal cervical lordosis. No
listhesis or subluxation.

Vertebrae: Vertebral body height maintained without evidence for
acute or chronic fracture. 9 mm benign hemangioma noted within the
T3 vertebral body. No other discrete or worrisome osseous lesions.
No abnormal marrow edema.

Cord: Signal intensity within the cervical spinal cord is normal.
Normal cord caliber and morphology.

Posterior Fossa, vertebral arteries, paraspinal tissues: Visualized
brain and posterior fossa within normal limits. Craniocervical
junction normal. Paraspinous and prevertebral soft tissues within
normal limits. Normal intravascular flow voids seen within the
vertebral arteries bilaterally.

Disc levels:

C2-C3: Unremarkable.

C3-C4: Mild disc bulge with uncovertebral hypertrophy. No
significant canal or foraminal stenosis.

C4-C5: Mild disc bulge. No significant canal or foraminal stenosis.

C5-C6: Small right paracentral disc protrusion mildly indents the
right ventral thecal sac. No significant spinal stenosis or cord
deformity. Foramina remain patent.

C6-C7: Small right paracentral disc protrusion indents the right
ventral thecal sac. No significant spinal stenosis or cord
deformity. Foramina remain patent.

C7-T1:  Unremarkable.

At T3-4, there is a small central disc protrusion mildly indenting
the ventral thecal sac. No stenosis or cord deformity. Remainder the
visualized upper thoracic spine is unremarkable.
IMPRESSION: 1. Small right paracentral disc protrusions at C5-6 and C6-7 without
significant stenosis or cord deformity.
2. Additional mild noncompressive disc bulging at C3-4 and C4-5
without stenosis.
3. Otherwise unremarkable and normal MRI of the cervical spine.

ADDENDUM:
In addition to the initially described findings, note is also made
of a sagittal T2 star sequence. No susceptibility artifact to
suggest acute or prior hemorrhage within the cervical spinal cord.

*** End of Addendum ***
FINDINGS: Alignment: Straightening of the normal cervical lordosis. No
listhesis or subluxation.

Vertebrae: Vertebral body height maintained without evidence for
acute or chronic fracture. 9 mm benign hemangioma noted within the
T3 vertebral body. No other discrete or worrisome osseous lesions.
No abnormal marrow edema.

Cord: Signal intensity within the cervical spinal cord is normal.
Normal cord caliber and morphology.

Posterior Fossa, vertebral arteries, paraspinal tissues: Visualized
brain and posterior fossa within normal limits. Craniocervical
junction normal. Paraspinous and prevertebral soft tissues within
normal limits. Normal intravascular flow voids seen within the
vertebral arteries bilaterally.

Disc levels:

C2-C3: Unremarkable.

C3-C4: Mild disc bulge with uncovertebral hypertrophy. No
significant canal or foraminal stenosis.

C4-C5: Mild disc bulge. No significant canal or foraminal stenosis.

C5-C6: Small right paracentral disc protrusion mildly indents the
right ventral thecal sac. No significant spinal stenosis or cord
deformity. Foramina remain patent.

C6-C7: Small right paracentral disc protrusion indents the right
ventral thecal sac. No significant spinal stenosis or cord
deformity. Foramina remain patent.

C7-T1:  Unremarkable.

At T3-4, there is a small central disc protrusion mildly indenting
the ventral thecal sac. No stenosis or cord deformity. Remainder the
visualized upper thoracic spine is unremarkable.
IMPRESSION: 1. Small right paracentral disc protrusions at C5-6 and C6-7 without
significant stenosis or cord deformity.
2. Additional mild noncompressive disc bulging at C3-4 and C4-5
without stenosis.
3. Otherwise unremarkable and normal MRI of the cervical spine.

## 2019-10-17 IMAGING — MR MR MRA HEAD W/O CM
1 series · 18 of 48 positions shown · non-contrast
Comparison: None available.

CLINICAL DATA: Initial evaluation for headaches. Family history of
familial cerebral cavernous malformation with prior hemorrhage.

EXAM:
MRI HEAD WITHOUT CONTRAST
MRA HEAD WITHOUT CONTRAST
TECHNIQUE: Multiplanar, multiecho pulse sequences of the brain and surrounding
structures were obtained without intravenous contrast. Angiographic
images of the head were obtained using MRA technique without
contrast.

[Series 13: TOF · axial · 0.5mm · 0.41mm/px · z∈[-72,+24]mm · 18 of 205 slices shown]
[im 1/205]
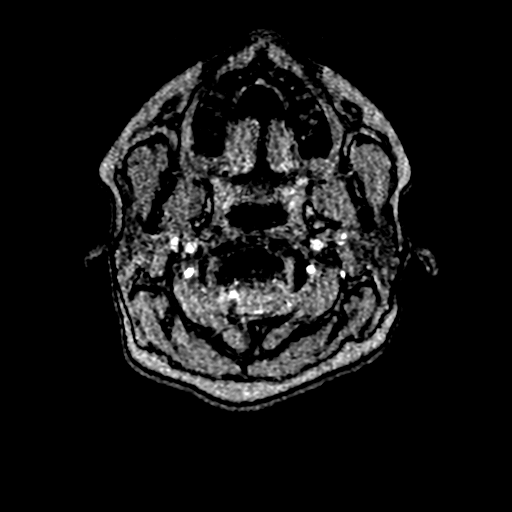
[im 5/205]
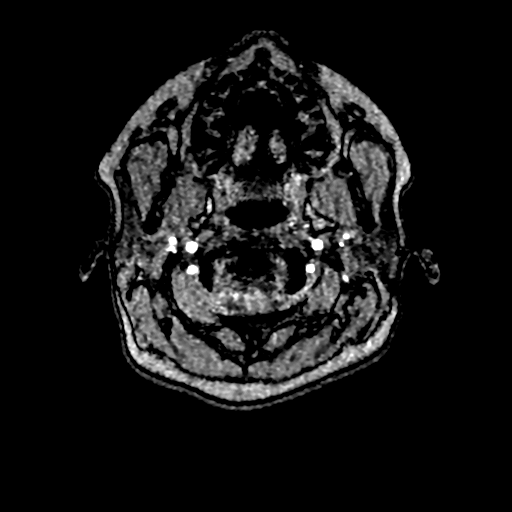
[im 9/205]
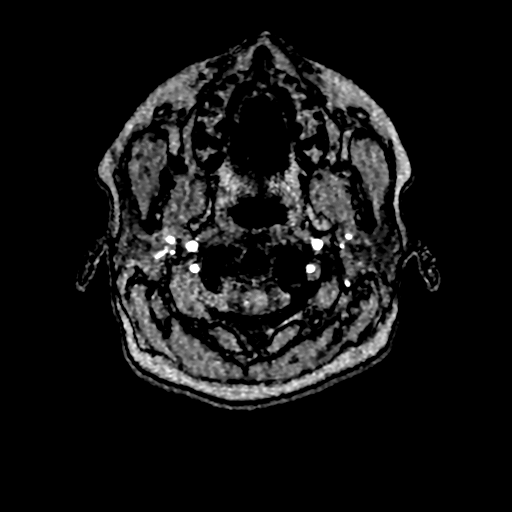
[im 14/205]
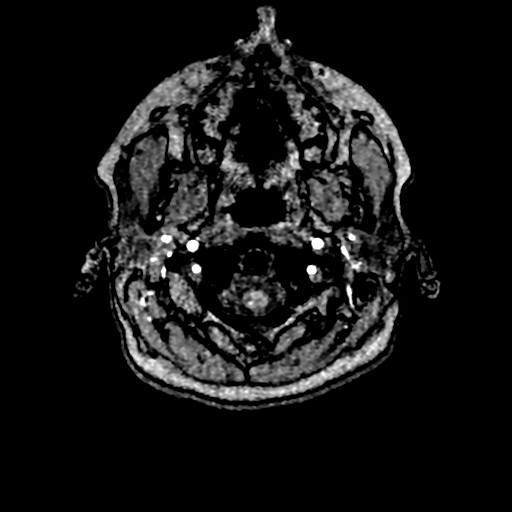
[im 18/205]
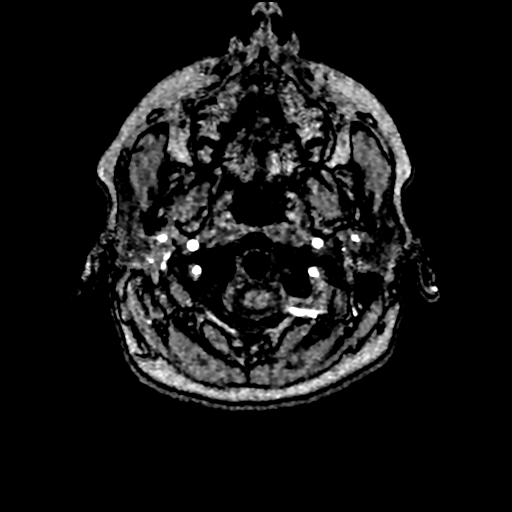
[im 22/205]
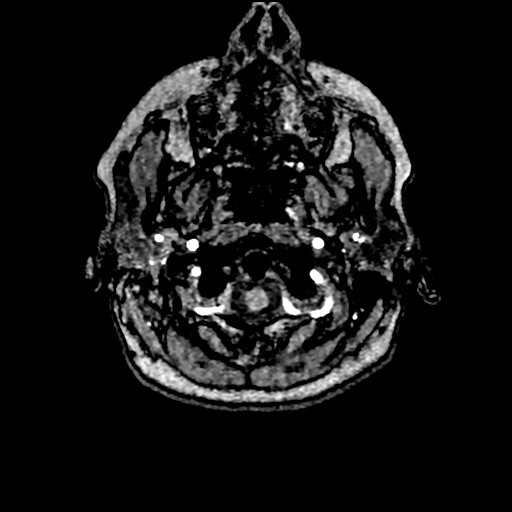
[im 27/205]
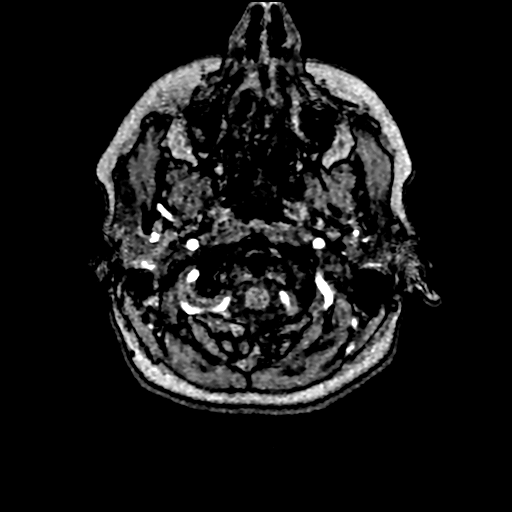
[im 31/205]
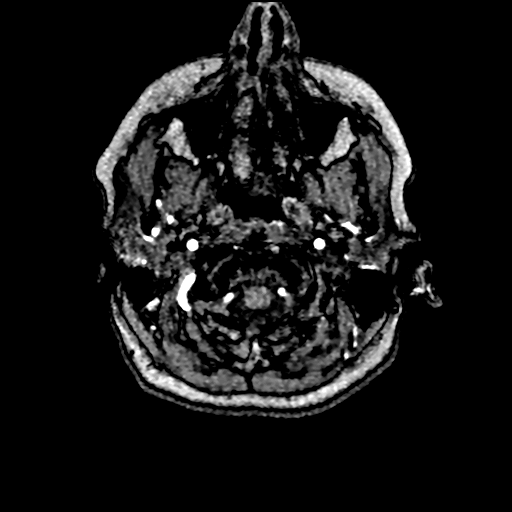
[im 35/205]
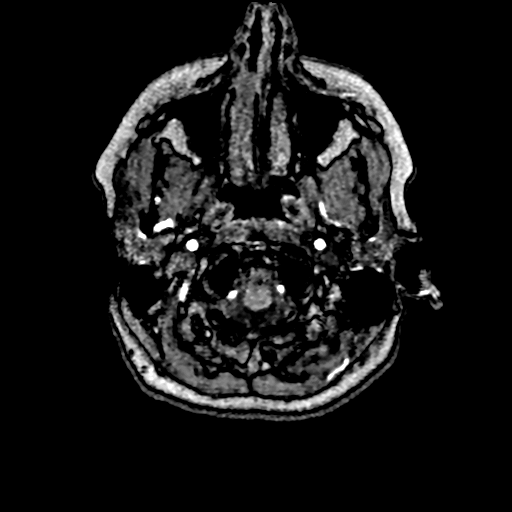
[im 40/205]
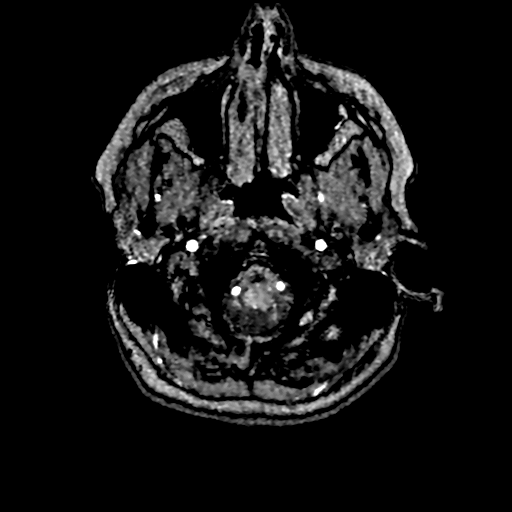
[im 66/205]
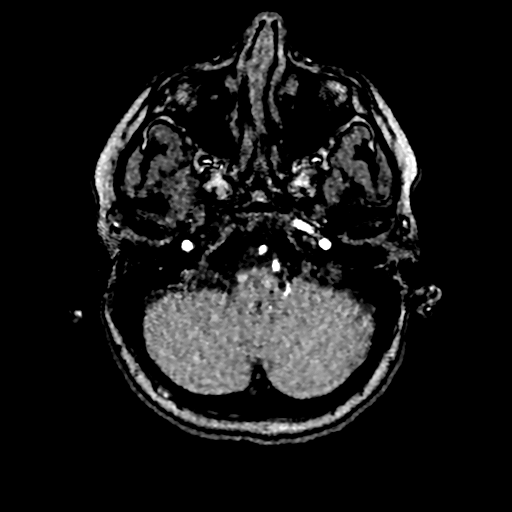
[im 92/205]
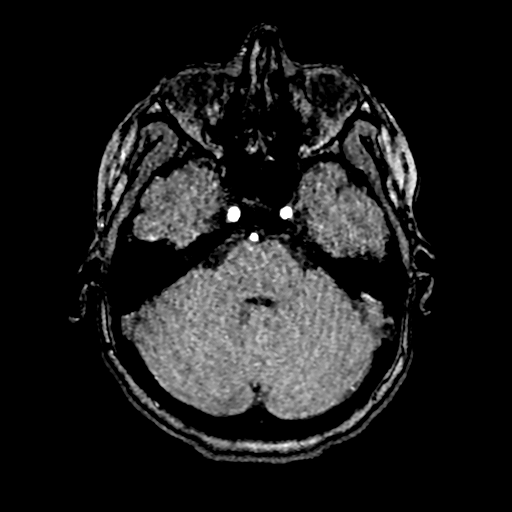
[im 105/205]
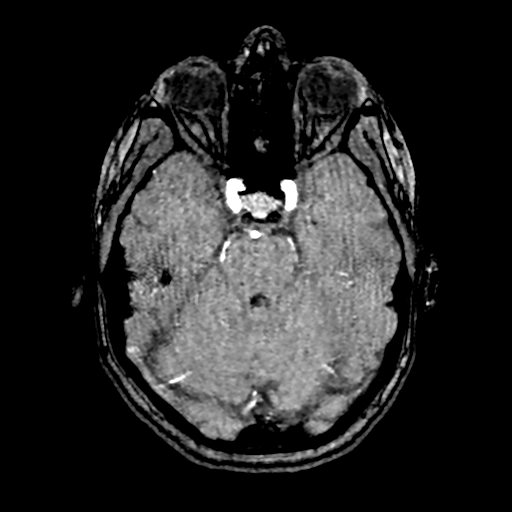
[im 118/205]
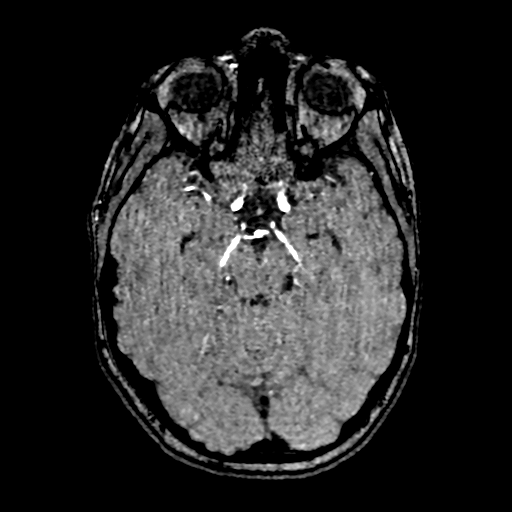
[im 144/205]
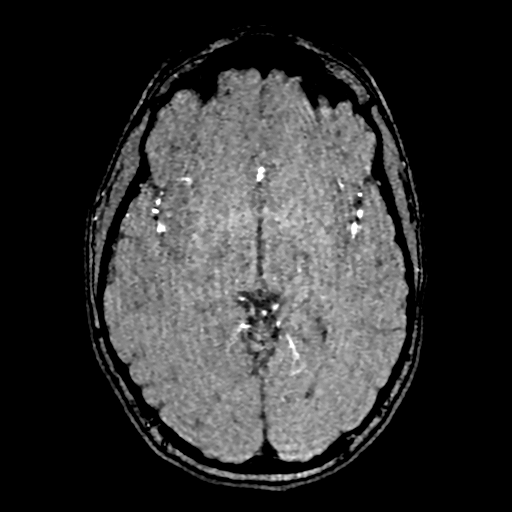
[im 170/205]
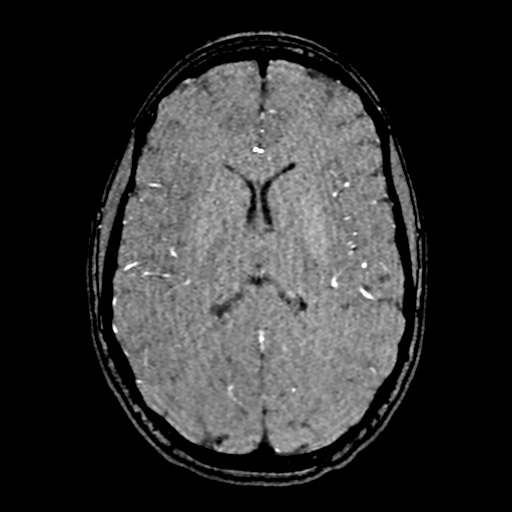
[im 174/205]
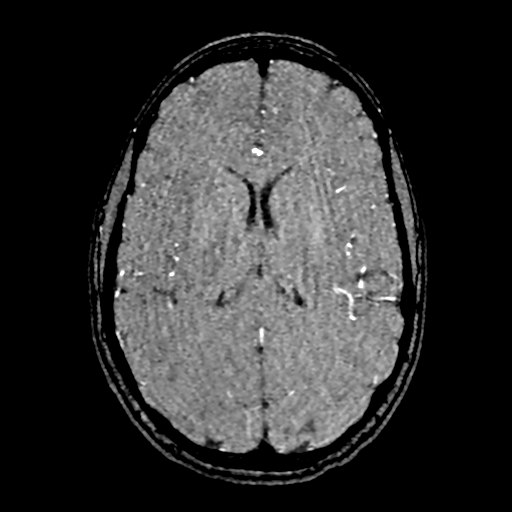
[im 196/205]
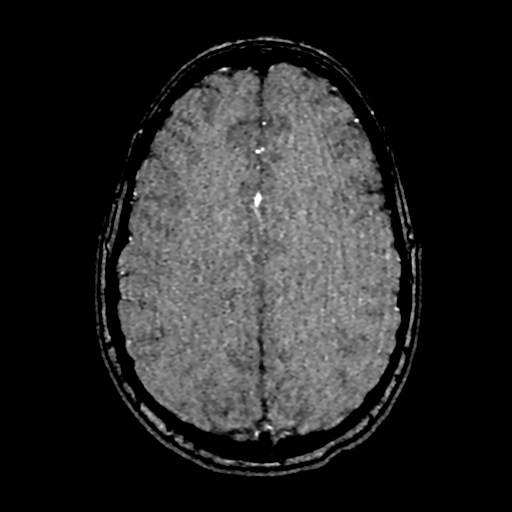

[18 of 48 positions shown; findings below may reference images not displayed]

FINDINGS: MRI HEAD FINDINGS

Brain: Cerebral volume within normal limits for patient age. No
focal parenchymal signal abnormality identified.

No abnormal foci of restricted diffusion to suggest acute or
subacute ischemia. Gray-white matter differentiation well
maintained. No encephalomalacia to suggest chronic infarction. No
foci of susceptibility artifact to suggest acute or chronic
intracranial hemorrhage. No intraparenchymal cavernoma identified.

No mass lesion, midline shift or mass effect. No hydrocephalus. No
extra-axial fluid collection. Major dural sinuses are grossly
patent.

Pituitary gland and suprasellar region are normal. Midline
structures intact and normal.

Vascular: Major intracranial vascular flow voids well maintained and
normal in appearance.

Skull and upper cervical spine: Craniocervical junction normal.
Visualized upper cervical spine within normal limits. Bone marrow
signal intensity normal. No scalp soft tissue abnormality.

Sinuses/Orbits: Globes and orbital soft tissues within normal
limits.

Mild scattered mucosal thickening noted within the ethmoidal air
cells. Small retention cyst noted within the left maxillary sinus.
Paranasal sinuses are otherwise clear. No mastoid effusion. Inner
ear structures grossly normal.

Other: None.

MRA HEAD FINDINGS

ANTERIOR CIRCULATION:

Visualized distal cervical segments of the internal carotid arteries
are widely patent with symmetric antegrade flow. Petrous, cavernous,
and supraclinoid ICAs widely patent without stenosis or other
abnormality. Origin of the ophthalmic arteries patent. ICA termini
well perfused. A1 segments patent bilaterally. Normal anterior
communicating artery complex. Anterior cerebral arteries widely
patent to their distal aspects without stenosis. No M1 stenosis or
occlusion. Normal MCA bifurcations. Distal MCA branches well
perfused and symmetric.

POSTERIOR CIRCULATION:

Vertebral arteries largely codominant and widely patent to the
vertebrobasilar junction. Both picas patent. Basilar widely patent
to its distal aspect without stenosis. Superior cerebral arteries
patent bilaterally. PCAs supplied via the basilar as well as
prominent bilateral posterior communicating arteries. Both PCAs well
perfused to their distal aspects without stenosis.

No intracranial aneurysm or other vascular abnormality.
IMPRESSION: 1. Normal brain MRI. No findings to explain patient's symptoms
identified. No intracranial cavernoma or evidence for acute or
chronic intracranial hemorrhage.
2. Normal intracranial MRA. No aneurysm or other vascular
abnormality identified.

## 2020-04-08 MED FILL — ADDERALL XR 10 MG CAP SA: 10 | 30 days supply | Qty: 30 | Fill #0
